# Patient Record
Sex: Female | Born: 1935 | Race: White | Hispanic: No | Marital: Married | Smoking: Never smoker
Health system: Southern US, Community
[De-identification: ages and names within clinical notes are randomized; demographics above are authoritative.]

## PROBLEM LIST (undated history)

## (undated) DIAGNOSIS — E079 Disorder of thyroid, unspecified: Secondary | ICD-10-CM

## (undated) DIAGNOSIS — M810 Age-related osteoporosis without current pathological fracture: Secondary | ICD-10-CM

## (undated) DIAGNOSIS — C4491 Basal cell carcinoma of skin, unspecified: Secondary | ICD-10-CM

## (undated) DIAGNOSIS — R001 Bradycardia, unspecified: Secondary | ICD-10-CM

## (undated) DIAGNOSIS — H409 Unspecified glaucoma: Secondary | ICD-10-CM

## (undated) DIAGNOSIS — I10 Essential (primary) hypertension: Secondary | ICD-10-CM

## (undated) HISTORY — PX: OSTEOCHONDROMA EXCISION: SHX2137

## (undated) HISTORY — PX: DILATION AND CURETTAGE OF UTERUS: SHX78

## (undated) HISTORY — PX: CERVICAL CONE BIOPSY: SUR198

## (undated) HISTORY — DX: Age-related osteoporosis without current pathological fracture: M81.0

## (undated) HISTORY — PX: ANKLE FRACTURE SURGERY: SHX122

## (undated) HISTORY — PX: CATARACT EXTRACTION, BILATERAL: SHX1313

## (undated) HISTORY — PX: TONSILLECTOMY: SUR1361

## (undated) HISTORY — DX: Unspecified glaucoma: H40.9

## (undated) HISTORY — PX: FRACTURE SURGERY: SHX138

## (undated) HISTORY — DX: Basal cell carcinoma of skin, unspecified: C44.91

## (undated) HISTORY — DX: Bradycardia, unspecified: R00.1

## (undated) HISTORY — PX: BLADDER SURGERY: SHX569

## (undated) HISTORY — PX: ABDOMINAL HYSTERECTOMY: SHX81

---

## 1998-12-28 ENCOUNTER — Ambulatory Visit (HOSPITAL_COMMUNITY): Admission: RE | Admit: 1998-12-28 | Discharge: 1998-12-28 | Payer: Self-pay | Admitting: Obstetrics & Gynecology

## 2003-08-01 ENCOUNTER — Encounter: Payer: Self-pay | Admitting: Emergency Medicine

## 2003-08-01 ENCOUNTER — Emergency Department (HOSPITAL_COMMUNITY): Admission: EM | Admit: 2003-08-01 | Discharge: 2003-08-01 | Payer: Self-pay | Admitting: Emergency Medicine

## 2003-09-01 ENCOUNTER — Ambulatory Visit (HOSPITAL_COMMUNITY): Admission: RE | Admit: 2003-09-01 | Discharge: 2003-09-01 | Payer: Self-pay | Admitting: Gastroenterology

## 2005-09-12 ENCOUNTER — Other Ambulatory Visit: Admission: RE | Admit: 2005-09-12 | Discharge: 2005-09-12 | Payer: Self-pay | Admitting: Family Medicine

## 2015-12-18 HISTORY — PX: BASAL CELL CARCINOMA EXCISION: SHX1214

## 2017-05-17 ENCOUNTER — Emergency Department (HOSPITAL_COMMUNITY): Payer: Medicare Other

## 2017-05-17 ENCOUNTER — Encounter (HOSPITAL_COMMUNITY): Payer: Self-pay | Admitting: Emergency Medicine

## 2017-05-17 ENCOUNTER — Emergency Department (HOSPITAL_COMMUNITY)
Admission: EM | Admit: 2017-05-17 | Discharge: 2017-05-17 | Disposition: A | Discharge status: Home / Self Care | Payer: Medicare Other | Attending: Emergency Medicine | Admitting: Emergency Medicine

## 2017-05-17 DIAGNOSIS — I10 Essential (primary) hypertension: Secondary | ICD-10-CM | POA: Insufficient documentation

## 2017-05-17 DIAGNOSIS — R531 Weakness: Secondary | ICD-10-CM | POA: Insufficient documentation

## 2017-05-17 HISTORY — DX: Essential (primary) hypertension: I10

## 2017-05-17 HISTORY — DX: Disorder of thyroid, unspecified: E07.9

## 2017-05-17 LAB — COMPREHENSIVE METABOLIC PANEL
ALK PHOS: 78 U/L (ref 38–126)
ALT: 13 U/L — AB (ref 14–54)
AST: 18 U/L (ref 15–41)
Albumin: 4.1 g/dL (ref 3.5–5.0)
Anion gap: 8 (ref 5–15)
BILIRUBIN TOTAL: 0.5 mg/dL (ref 0.3–1.2)
BUN: 27 mg/dL — AB (ref 6–20)
CALCIUM: 9.3 mg/dL (ref 8.9–10.3)
CO2: 23 mmol/L (ref 22–32)
CREATININE: 0.71 mg/dL (ref 0.44–1.00)
Chloride: 110 mmol/L (ref 101–111)
GFR calc non Af Amer: >60 mL/min (ref >60)
Glucose, Bld: 101 mg/dL — ABNORMAL HIGH (ref 65–99)
Potassium: 4.4 mmol/L (ref 3.5–5.1)
Sodium: 141 mmol/L (ref 135–145)
Total Protein: 6.9 g/dL (ref 6.5–8.1)

## 2017-05-17 LAB — CBC WITH DIFFERENTIAL/PLATELET
Basophils Absolute: 0 10*3/uL (ref 0.0–0.1)
Basophils Relative: 0 %
Eosinophils Absolute: 0.2 10*3/uL (ref 0.0–0.7)
Eosinophils Relative: 2 %
HEMATOCRIT: 38.3 % (ref 36.0–46.0)
Hemoglobin: 12.5 g/dL (ref 12.0–15.0)
Lymphocytes Relative: 29 %
Lymphs Abs: 2.4 10*3/uL (ref 0.7–4.0)
MCH: 29.4 pg (ref 26.0–34.0)
MCHC: 32.6 g/dL (ref 30.0–36.0)
MCV: 90.1 fL (ref 78.0–100.0)
MONO ABS: 0.8 10*3/uL (ref 0.1–1.0)
MONOS PCT: 9 %
NEUTROS ABS: 5.1 10*3/uL (ref 1.7–7.7)
Neutrophils Relative %: 60 %
PLATELETS: 341 10*3/uL (ref 150–400)
RBC: 4.25 MIL/uL (ref 3.87–5.11)
RDW: 13.3 % (ref 11.5–15.5)
WBC: 8.5 10*3/uL (ref 4.0–10.5)

## 2017-05-17 LAB — URINALYSIS, ROUTINE W REFLEX MICROSCOPIC
Bilirubin Urine: NEGATIVE
GLUCOSE, UA: NEGATIVE mg/dL
HGB URINE DIPSTICK: NEGATIVE
Ketones, ur: NEGATIVE mg/dL
LEUKOCYTES UA: NEGATIVE
Nitrite: NEGATIVE
PH: 6 (ref 5.0–8.0)
Protein, ur: NEGATIVE mg/dL
SPECIFIC GRAVITY, URINE: 1.006 (ref 1.005–1.030)

## 2017-05-17 LAB — LACTIC ACID, PLASMA: LACTIC ACID, VENOUS: 0.8 mmol/L (ref 0.5–1.9)

## 2017-05-17 LAB — TROPONIN I

## 2017-05-17 NOTE — ED Notes (Signed)
Pt verbalized understanding of discharge instructions and denies any further questions at this time.   

## 2017-05-17 NOTE — ED Notes (Signed)
Bed: TD42 Expected date:  Expected time:  Means of arrival:  Comments: EMS 77 F

## 2017-05-17 NOTE — Discharge Instructions (Signed)
Take your usual prescriptions as previously directed.  Take your blood pressure only once per day, a few times per week, either in the morning approximately 1 hour after you take your medicine(s) or in the evening before you go to bed.  Always sit quietly for at least 15 minutes before taking your blood pressure.  Keep a diary of your blood pressures to show your doctor at your follow up office visit, as well as your BP monitor to check it against your doctor's office BP reading.  Call your regular medical doctor on Monday morning to schedule a follow up appointment for Monday or Tuesday.  Return to the Emergency Department immediately sooner if worsening.

## 2017-05-17 NOTE — ED Notes (Signed)
ED Provider at bedside. 

## 2017-05-17 NOTE — ED Provider Notes (Signed)
[facility] DEPT Provider Note   CSN: [id number] Arrival date & time: 05/17/17  1515     History   Chief Complaint Chief Complaint  Patient presents with  . Weakness  . Abnormal BP Readings    HPI Kimberly Cline is a 83 y.o. female.  HPI  Pt was seen at 1540.  Per pt, c/o sudden onset and resolution of one episode of generalized weakness/fatigue that occurred today approximately 1130 PTA. Pt states she was standing inside her house and cleaning hummingbird feeders when her symptoms began. Pt states she then took her BP multiple times with SBP's ranging from 90's to 160's. Pt states her previous meals were approximately 8am and 1230pm. Pt states she "feels fine now."  Denies any other symptoms. Denies visual changes, no focal motor weakness, no tingling/numbness in extremities, no ataxia, no slurred speech, no facial droop, no near syncope/syncope, no CP/palpitations, no SOB/cough, no abd pain, no N/V/D.    Past Medical History:  Diagnosis Date  . Hypertension   . Thyroid disease     There are no active problems to display for this patient.   Past Surgical History:  Procedure Laterality Date  . ABDOMINAL HYSTERECTOMY    . ANKLE FRACTURE SURGERY    . BLADDER SURGERY    . DILATION AND CURETTAGE OF UTERUS    . EYE SURGERY    . FRACTURE SURGERY    . OSTEOCHONDROMA EXCISION      OB History    No data available       Home Medications    Prior to Admission medications   Not on File    Family History History reviewed. No pertinent family history.  Social History Social History  Substance Use Topics  . Smoking status: Never Smoker  . Smokeless tobacco: Never Used  . Alcohol use Yes     Allergies   Patient has no known allergies.   Review of Systems Review of Systems ROS: Statement: All systems negative except as marked or noted in the HPI; Constitutional: Negative for fever and chills. +generalized weakness/fatigue.; ; Eyes: Negative for eye pain,  redness and discharge. ; ; ENMT: Negative for ear pain, hoarseness, nasal congestion, sinus pressure and sore throat. ; ; Cardiovascular: Negative for chest pain, palpitations, diaphoresis, dyspnea and peripheral edema. ; ; Respiratory: Negative for cough, wheezing and stridor. ; ; Gastrointestinal: Negative for nausea, vomiting, diarrhea, abdominal pain, blood in stool, hematemesis, jaundice and rectal bleeding. . ; ; Genitourinary: Negative for dysuria, flank pain and hematuria. ; ; Musculoskeletal: Negative for back pain and neck pain. Negative for swelling and trauma.; ; Skin: Negative for pruritus, rash, abrasions, blisters, bruising and skin lesion.; ; Neuro: Negative for headache, lightheadedness and neck stiffness. Negative for altered level of consciousness, altered mental status, extremity weakness, paresthesias, involuntary movement, seizure and syncope.     Physical Exam Updated Vital Signs BP (!) 151/67 (BP Location: Left Arm)   Pulse 70   Temp 97.8 F (36.6 C) (Oral)   Resp (!) 21   Ht 5\' 4"  (1.626 m)   Wt 53.5 kg (118 lb)   SpO2 96%   BMI 20.25 kg/m   BP (!) 145/69   Pulse (!) 55   Temp 97.8 F (36.6 C) (Oral)   Resp (!) 23   Ht 5\' 4"  (1.626 m)   Wt 53.5 kg (118 lb)   SpO2 98%   BMI 20.25 kg/m    15:35:27 Orthostatic Vital Signs BL  Orthostatic Lying  BP- Lying: 136/63 (Pt lying flat for 5 mins prior to retrieval )  Pulse- Lying: 58      Orthostatic Sitting  BP- Sitting: 136/59  Pulse- Sitting: 57      Orthostatic Standing at 0 minutes  BP- Standing at 0 minutes: 146/63  Pulse- Standing at 0 minutes: 62    Physical Exam 1545: Physical examination:  Nursing notes reviewed; Vital signs and O2 SAT reviewed;  Constitutional: Well developed, Well nourished, Well hydrated, In no acute distress; Head:  Normocephalic, atraumatic; Eyes: EOMI, PERRL, No scleral icterus; ENMT: TM's clear bilat. Mouth and pharynx normal, Mucous membranes moist; Neck: Supple, Full  range of motion, No lymphadenopathy; Cardiovascular: Regular rate and rhythm, No gallop; Respiratory: Breath sounds clear & equal bilaterally, No wheezes.  Speaking full sentences with ease, Normal respiratory effort/excursion; Chest: Nontender, Movement normal; Abdomen: Soft, Nontender, Nondistended, Normal bowel sounds; Genitourinary: No CVA tenderness; Extremities: Pulses normal, No tenderness, No edema, No calf edema or asymmetry.; Neuro: AA&Ox3, Major CN grossly intact. Speech clear.  No facial droop.  No nystagmus. Grips equal. Strength 5/5 equal bilat UE's and LE's.  DTR 2/4 equal bilat UE's and LE's.  No gross sensory deficits.  Normal cerebellar testing bilat UE's (finger-nose) and LE's (heel-shin)..; Skin: Color normal, Warm, Dry.   ED Treatments / Results  Labs (all labs ordered are listed, but only abnormal results are displayed)   EKG  EKG Interpretation  Date/Time:  Friday May 17 2017 15:33:51 EDT Ventricular Rate:  57 PR Interval:    QRS Duration: 108 QT Interval:  419 QTC Calculation: 408 R Axis:   70 Text Interpretation:  Sinus rhythm RSR' in V1 or V2, probably normal variant Baseline wander No old tracing to compare Confirmed by [facility]  MD, Nunzio Cory [phone]) on 05/17/2017 3:51:33 PM       Radiology   Procedures Procedures (including critical care time)  Medications Ordered in ED Medications - No data to display   Initial Impression / Assessment and Plan / ED Course  I have reviewed the triage vital signs and the nursing notes.  Pertinent labs & imaging results that were available during my care of the patient were reviewed by me and considered in my medical decision making (see chart for details).  MDM Reviewed: previous chart, nursing note and vitals Reviewed previous: labs Interpretation: labs, ECG, x-ray and CT scan   Results for orders placed or performed during the hospital encounter of 05/17/17  Urinalysis, Routine w reflex microscopic  Result  Value Ref Range   Color, Urine STRAW (A) YELLOW   APPearance CLEAR CLEAR   Specific Gravity, Urine 1.006 1.005 - 1.030   pH 6.0 5.0 - 8.0   Glucose, UA NEGATIVE NEGATIVE mg/dL   Hgb urine dipstick NEGATIVE NEGATIVE   Bilirubin Urine NEGATIVE NEGATIVE   Ketones, ur NEGATIVE NEGATIVE mg/dL   Protein, ur NEGATIVE NEGATIVE mg/dL   Nitrite NEGATIVE NEGATIVE   Leukocytes, UA NEGATIVE NEGATIVE  Comprehensive metabolic panel  Result Value Ref Range   Sodium 141 135 - 145 mmol/L   Potassium 4.4 3.5 - 5.1 mmol/L   Chloride 110 101 - 111 mmol/L   CO2 23 22 - 32 mmol/L   Glucose, Bld 101 (H) 65 - 99 mg/dL   BUN 27 (H) 6 - 20 mg/dL   Creatinine, Ser 0.71 0.44 - 1.00 mg/dL   Calcium 9.3 8.9 - 10.3 mg/dL   Total Protein 6.9 6.5 - 8.1 g/dL   Albumin 4.1 3.5 - 5.0 g/dL  AST 18 15 - 41 U/L   ALT 13 (L) 14 - 54 U/L   Alkaline Phosphatase 78 38 - 126 U/L   Total Bilirubin 0.5 0.3 - 1.2 mg/dL   GFR calc non Af Amer >60 >60 mL/min   GFR calc Af Amer >60 >60 mL/min   Anion gap 8 5 - 15  Troponin I  Result Value Ref Range   Troponin I <0.03 <0.03 ng/mL  Lactic acid, plasma  Result Value Ref Range   Lactic Acid, Venous 0.8 0.5 - 1.9 mmol/L  CBC with Differential  Result Value Ref Range   WBC 8.5 4.0 - 10.5 K/uL   RBC 4.25 3.87 - 5.11 MIL/uL   Hemoglobin 12.5 12.0 - 15.0 g/dL   HCT 38.3 36.0 - 46.0 %   MCV 90.1 78.0 - 100.0 fL   MCH 29.4 26.0 - 34.0 pg   MCHC 32.6 30.0 - 36.0 g/dL   RDW 13.3 11.5 - 15.5 %   Platelets 341 150 - 400 K/uL   Neutrophils Relative % 60 %   Neutro Abs 5.1 1.7 - 7.7 K/uL   Lymphocytes Relative 29 %   Lymphs Abs 2.4 0.7 - 4.0 K/uL   Monocytes Relative 9 %   Monocytes Absolute 0.8 0.1 - 1.0 K/uL   Eosinophils Relative 2 %   Eosinophils Absolute 0.2 0.0 - 0.7 K/uL   Basophils Relative 0 %   Basophils Absolute 0.0 0.0 - 0.1 K/uL   Dg Chest 2 View Result Date: 05/17/2017 CLINICAL DATA:  83 year old female with weakness and labile blood pressure. EXAM: CHEST  2  VIEW COMPARISON:  None. FINDINGS: Cardiac size is at the upper limits of normal. Mild Calcified aortic atherosclerosis. Other mediastinal contours are within normal limits. Visualized tracheal air column is within normal limits. Large lung volumes but no pneumothorax, pulmonary edema, pleural effusion or confluent pulmonary opacity. Osteopenia. No acute osseous abnormality identified. Negative visible bowel gas pattern. IMPRESSION: No acute cardiopulmonary abnormality. Electronically Signed   By: Genevie Ann M.D.   On: 05/17/2017 16:20   Ct Head Wo Contrast Result Date: 05/17/2017 CLINICAL DATA:  Labile blood pressures.  Weakness. EXAM: CT HEAD WITHOUT CONTRAST TECHNIQUE: Contiguous axial images were obtained from the base of the skull through the vertex without intravenous contrast. COMPARISON:  None. FINDINGS: Brain: Normal appearance for age with mild volume loss but no evidence of old or acute small or large vessel infarction. No mass lesion, hemorrhage, hydrocephalus or extra-axial collection. Vascular: There is atherosclerotic calcification of the major vessels at the base of the brain. Skull: Normal Sinuses/Orbits: Clear/normal Other: None IMPRESSION: No acute or significant finding. Mild age related volume loss. Normal study for age. Electronically Signed   By: Nelson Chimes M.D.   On: 05/17/2017 16:01    1830:  Pt is not orthostatic on VS. Pt has tol PO well without N/V. Pt has ambulated with steady gait, easy resps, NAD. VS remain stable. Workup is reassuring. No clear indication for admission at this time. Pt wants to go home. Dx and testing d/w pt.  Questions answered.  Verb understanding, agreeable to d/c home with outpt f/u.   Final Clinical Impressions(s) / ED Diagnoses   Final diagnoses:  None    New Prescriptions New Prescriptions   No medications on file     Francine Graven, DO 05/20/17 2106

## 2017-05-17 NOTE — ED Notes (Signed)
Patient transported to X-ray 

## 2017-05-17 NOTE — ED Triage Notes (Signed)
EMS with ambulatory patient who c/o fluctuating blood pressures. Readings have been any where from systolic of 75'Q to 492'E. PCP advised pt to come via EMS due to accompanied weakness. Pt alert and ambulatory denies pain. NAD at triage.  Vitals stable. EKG in process.

## 2017-05-19 LAB — URINE CULTURE: Culture: 20000 — AB

## 2017-05-23 ENCOUNTER — Encounter (INDEPENDENT_AMBULATORY_CARE_PROVIDER_SITE_OTHER): Payer: Self-pay

## 2017-05-23 ENCOUNTER — Encounter: Payer: Self-pay | Admitting: Internal Medicine

## 2017-05-23 ENCOUNTER — Ambulatory Visit (INDEPENDENT_AMBULATORY_CARE_PROVIDER_SITE_OTHER): Payer: Medicare Other | Admitting: Internal Medicine

## 2017-05-23 VITALS — BP 162/64 | HR 53 | Ht 63.0 in | Wt 120.0 lb

## 2017-05-23 DIAGNOSIS — I10 Essential (primary) hypertension: Secondary | ICD-10-CM | POA: Diagnosis not present

## 2017-05-23 DIAGNOSIS — R001 Bradycardia, unspecified: Secondary | ICD-10-CM

## 2017-05-23 DIAGNOSIS — R55 Syncope and collapse: Secondary | ICD-10-CM

## 2017-05-23 NOTE — Progress Notes (Signed)
ELECTROPHYSIOLOGY CONSULT NOTE  Patient ID: Kimberly Cline, MRN: [id number], DOB/AGE: 02/01/1932 83 y.o. Admit date: (Not on file) Date of Consult: 05/23/2017  Primary Physician: Carol Ada, MD Primary Cardiologist: new   Kimberly Cline is being seen today for the evaluation of hypotension at the request of  Carol Ada, MD.   HPI Kimberly Cline is a 83 y.o. female  Referred because of an unusual spell. Last week she felt weak. Her husband took her blood pressure home and was noted to be 66/40 or so with a pulse of 48. Over the ensuing 15 minutes on repeated measurements it gradually came up to the mid 90s. Finally got in touch with the doctor's office who recommended they call EMS. By then blood pressure is 120. On arrival of EMS for vital signs demonstrated a blood pressure 160. Heart rates were in the 50s.  Her husband noted that she had been pale. This event occurred while she was standing. She had a previous episode a couple weeks before; however, she recalls none of the events surrounding this occurring.  She does not have orthostatic lightheadedness. She does not have shower intolerance. She's had no problems with constipation and difficulty with her bladder or dry mouth or dry eyes.  Orthostatics  were normal in emergency room.  Other laboratories were unremarkable 05/17/17 from the emergency room    Past Medical History:  Diagnosis Date  . Glaucoma   . Hypertension   . Nodular basal cell carcinoma   . Osteoporosis   . Thyroid disease       Surgical History:  Past Surgical History:  Procedure Laterality Date  . ABDOMINAL HYSTERECTOMY    . ANKLE FRACTURE SURGERY    . BASAL CELL CARCINOMA EXCISION  2017  . BLADDER SURGERY    . CATARACT EXTRACTION, BILATERAL Bilateral 12 & 11 of 2016  . CERVICAL CONE BIOPSY    . DILATION AND CURETTAGE OF UTERUS    . EYE SURGERY    . FRACTURE SURGERY    . OSTEOCHONDROMA EXCISION    . TONSILLECTOMY        Home Meds: Prior to Admission medications   Medication Sig Start Date End Date Taking? Authorizing Provider  amLODipine (NORVASC) 5 MG tablet Take 5 mg by mouth daily.   Yes [provider]  levothyroxine (SYNTHROID, LEVOTHROID) 88 MCG tablet Take 88 mcg by mouth daily before breakfast.    Yes [provider]  lisinopril (PRINIVIL,ZESTRIL) 20 MG tablet Take 20 mg by mouth daily.   Yes [provider]  timolol (BETIMOL) 0.5 % ophthalmic solution Place 1 drop into both eyes daily.   Yes [provider]    Allergies:  Allergies  Allergen Reactions  . Actonel [Risedronate Sodium]     Serve muscle pain  . Evista [Raloxifene Hcl]     Leg cramps    Social History   Social History  . Marital status: Single    Spouse name: N/A  . Number of children: N/A  . Years of education: N/A   Occupational History  . Not on file.   Social History Main Topics  . Smoking status: Never Smoker  . Smokeless tobacco: Never Used  . Alcohol use Yes  . Drug use: No  . Sexual activity: Not on file   Other Topics Concern  . Not on file   Social History Narrative  . No narrative on file     Family History  Problem  Relation Age of Onset  . Other Mother        gun shot wound  . Hypertension Father   . Cancer Sister        breast  . Other Sister        mets  . CVA Brother   . Diabetes Brother   . Hypertension Brother   . Rheum arthritis Daughter   . Diabetes Son        on dialysis     ROS:  Please see the history of present illness.     All other systems reviewed and negative.    Physical Exam: Blood pressure (!) 162/64, pulse (!) 53, height 5\' 3"  (1.6 m), weight 120 lb (54.4 kg), SpO2 98 %. General: Well developed, well nourished female in no acute distress. Head: Normocephalic, atraumatic, sclera non-icteric, no xanthomas, nares are without discharge. EENT: normal  Lymph Nodes:  none Neck: Negative for carotid bruits. JVD not  elevated. Back:without scoliosis kyphosis Lungs: Clear bilaterally to auscultation without wheezes, rales, or rhonchi. Breathing is unlabored. Heart: RRR with S1 S2.  2/6 systolic murmur . No rubs, or gallops appreciated. Abdomen: Soft, non-tender, non-distended with normoactive bowel sounds. No hepatomegaly. No rebound/guarding. No obvious abdominal masses. Msk:  Strength and tone appear normal for age. Extremities: No clubbing or cyanosis. No edema.  Distal pedal pulses are 2+ and equal bilaterally. Skin: Warm and Dry Neuro: Alert and oriented X 3. CN III-XII intact Grossly normal sensory and motor function . Psych:  Responds to questions appropriately with a normal affect.      Labs: Cardiac Enzymes No results for input(s): CKTOTAL, CKMB, TROPONINI in the last 72 hours. CBC Lab Results  Component Value Date   WBC 8.5 05/17/2017   HGB 12.5 05/17/2017   HCT 38.3 05/17/2017   MCV 90.1 05/17/2017   PLT 341 05/17/2017   PROTIME: No results for input(s): LABPROT, INR in the last 72 hours. Chemistry  Recent Labs Lab 05/17/17 1625  NA 141  K 4.4  CL 110  CO2 23  BUN 27*  CREATININE 0.71  CALCIUM 9.3  PROT 6.9  BILITOT 0.5  ALKPHOS 78  ALT 13*  AST 18  GLUCOSE 101*   Lipids No results found for: CHOL, HDL, LDLCALC, TRIG BNP No results found for: PROBNP Thyroid Function Tests: No results for input(s): TSH, T4TOTAL, T3FREE, THYROIDAB in the last 72 hours.  Invalid input(s): FREET3 Miscellaneous No results found for: DDIMER  Radiology/Studies:  Dg Chest 2 View  Result Date: 05/17/2017 CLINICAL DATA:  83 year old female with weakness and labile blood pressure. EXAM: CHEST  2 VIEW COMPARISON:  None. FINDINGS: Cardiac size is at the upper limits of normal. Mild Calcified aortic atherosclerosis. Other mediastinal contours are within normal limits. Visualized tracheal air column is within normal limits. Large lung volumes but no pneumothorax, pulmonary edema, pleural  effusion or confluent pulmonary opacity. Osteopenia. No acute osseous abnormality identified. Negative visible bowel gas pattern. IMPRESSION: No acute cardiopulmonary abnormality. Electronically Signed   By: Genevie Ann M.D.   On: 05/17/2017 16:20   Ct Head Wo Contrast  Result Date: 05/17/2017 CLINICAL DATA:  Labile blood pressures.  Weakness. EXAM: CT HEAD WITHOUT CONTRAST TECHNIQUE: Contiguous axial images were obtained from the base of the skull through the vertex without intravenous contrast. COMPARISON:  None. FINDINGS: Brain: Normal appearance for age with mild volume loss but no evidence of old or acute small or large vessel infarction. No mass lesion, hemorrhage, hydrocephalus or extra-axial  collection. Vascular: There is atherosclerotic calcification of the major vessels at the base of the brain. Skull: Normal Sinuses/Orbits: Clear/normal Other: None IMPRESSION: No acute or significant finding. Mild age related volume loss. Normal study for age. Electronically Signed   By: Nelson Chimes M.D.   On: 05/17/2017 16:01    EKG: sinus @ 51 13/09/44  Assessment and Plan:  Hypotension   The patient had an episode of hypotension documented at home. The patient was pale suggesting that the data was correct. The symptoms also support. At the data was correct. Serial blood pressure monitoring over the ensuing hour demonstrated relatively rapid normalization. On arrival of EMS the data were normal. Orthostatic vital signs are unrevealing. In the emergency room in today. She does not have other symptoms to suggest systemic autonomic dysfunction. In the absence of objective evidence of orthostasis, or systemic diseases like amyloid, Shy-Drager are hard to invoke. The only trigger that I see is prolonged standing; unfortunately her first episode she doesn't recall.  Hence, I have advised her to be cognizant of the prodrome. She should be sitting or recombinant with his recurring. I suggested to her husband that  they get an [employer] monitor.  Her heart rate of 48 is unlikely responsible as today with a heart rate of 51 her blood pressure is 160. Her heart rhythm at 48 might have been quite different, i.e. subcutaneous like AIVR or junctional rhythm that could have been associated with a pacemaker like syndrome that derives from simultaneous AV  contraction. Hence,  elucidation of the heart rhythm might be informative.         Virl Axe

## 2017-05-23 NOTE — Patient Instructions (Addendum)
Medication Instructions:  Your physician recommends that you continue on your current medications as directed. Please refer to the Current Medication list given to you today.   Labwork: None Ordered   Testing/Procedures: None Ordered    Follow-Up: Your physician recommends that you schedule a follow-up appointment in: 3-4 months with Dr. Caryl Comes     Any Other Special Instructions Will Be Listed Below (If Applicable).     If you need a refill on your cardiac medications before your next appointment, please call your pharmacy.

## 2017-08-30 ENCOUNTER — Encounter: Payer: Self-pay | Admitting: Internal Medicine

## 2017-09-13 ENCOUNTER — Encounter: Payer: Self-pay | Admitting: Internal Medicine

## 2017-09-13 ENCOUNTER — Ambulatory Visit (INDEPENDENT_AMBULATORY_CARE_PROVIDER_SITE_OTHER): Payer: Medicare Other | Admitting: Internal Medicine

## 2017-09-13 VITALS — BP 140/70 | HR 64 | Ht 63.0 in | Wt 122.0 lb

## 2017-09-13 DIAGNOSIS — R001 Bradycardia, unspecified: Secondary | ICD-10-CM | POA: Diagnosis not present

## 2017-09-13 DIAGNOSIS — I959 Hypotension, unspecified: Secondary | ICD-10-CM | POA: Diagnosis not present

## 2017-09-13 NOTE — Progress Notes (Signed)
      Patient Care Team: Carol Ada, MD as PCP - General (Family Medicine)   HPI  Kimberly Cline is a 83 y.o. female Seen in followup for a weak spell associated with hypotension in the context of chronic bradycardia   She feels good.  The patient denies chest pain, shortness of breath, nocturnal dyspnea, orthopnea or peripheral edema.  There have been no palpitations, lightheadedness or syncope.    She was advised to get an [employer] Monitor to try and elucidate the bradycardia that was noted-- but no interval events      Past Medical History:  Diagnosis Date  . Glaucoma   . Hypertension   . Nodular basal cell carcinoma   . Osteoporosis   . Thyroid disease     Past Surgical History:  Procedure Laterality Date  . ABDOMINAL HYSTERECTOMY    . ANKLE FRACTURE SURGERY    . BASAL CELL CARCINOMA EXCISION  2017  . BLADDER SURGERY    . CATARACT EXTRACTION, BILATERAL Bilateral 12 & 11 of 2016  . CERVICAL CONE BIOPSY    . DILATION AND CURETTAGE OF UTERUS    . EYE SURGERY    . FRACTURE SURGERY    . OSTEOCHONDROMA EXCISION    . TONSILLECTOMY      Current Outpatient Prescriptions  Medication Sig Dispense Refill  . amLODipine (NORVASC) 5 MG tablet Take 5 mg by mouth daily.    Marland Kitchen levothyroxine (SYNTHROID, LEVOTHROID) 88 MCG tablet Take 88 mcg by mouth daily before breakfast.     . lisinopril (PRINIVIL,ZESTRIL) 20 MG tablet Take 20 mg by mouth daily.    . timolol (BETIMOL) 0.5 % ophthalmic solution Place 1 drop into both eyes daily.     No current facility-administered medications for this visit.     Allergies  Allergen Reactions  . Actonel [Risedronate Sodium]     Serve muscle pain  . Evista [Raloxifene Hcl]     Leg cramps      Review of Systems negative except from HPI and PMH  Physical Exam BP 140/70   Pulse 64   Ht 5\' 3"  (1.6 m)   Wt 122 lb (55.3 kg)   SpO2 97%   BMI 21.61 kg/m  Well developed and nourished in no acute distress HENT normal Neck  supple with JVP-flat Carotids brisk and full without bruits Clear Regular rate and rhythm, 2/6 sys +S4  Abd-soft with active BS without hepatomegaly No Clubbing cyanosis edema Skin-warm and dry A & Oriented  Grossly normal sensory and motor function   ECG sinus 52 15/08/42  Assessment and  Plan  Hypertension  Spell   She is stable without recurrent symptoms and BP is reasonably controlled  Will see again prn   She is to see her PCP next months     Current medicines are reviewed at length with the patient today .  The patient does not  have concerns regarding medicines.

## 2017-09-13 NOTE — Patient Instructions (Signed)
Medication Instructions: - Your physician recommends that you continue on your current medications as directed. Please refer to the Current Medication list given to you today.  Labwork: - none ordered  Procedures/Testing: - none ordered  Follow-Up: - Dr. Klein will see you back on an as needed basis.  Any Additional Special Instructions Will Be Listed Below (If Applicable).     If you need a refill on your cardiac medications before your next appointment, please call your pharmacy.   

## 2018-07-17 NOTE — Progress Notes (Signed)
Chief Complaint  Patient presents with  . Follow-up    bradycardia   History of Present Illness: 84 yo female with history of HTN and bradycardia who is here today for cardiac follow up. I take care of her husband but have not seen her as a patient in my office. She has been followed by Dr. Caryl Comes for bradycardia. She is here today to establish care with me. She has no prior cardiac disease. She denies chest pain or dyspnea. No LE edema. She had a near syncopal event in June 2018 at home with low heart and low BP. Dr. Caryl Comes evaluated her and did not suggest further evaluation.   Primary Care Physician: Carol Ada, MD  Past Medical History:  Diagnosis Date  . Glaucoma   . Hypertension   . Nodular basal cell carcinoma   . Osteoporosis   . Thyroid disease     Past Surgical History:  Procedure Laterality Date  . ABDOMINAL HYSTERECTOMY    . ANKLE FRACTURE SURGERY    . BASAL CELL CARCINOMA EXCISION  2017  . BLADDER SURGERY    . CATARACT EXTRACTION, BILATERAL Bilateral 12 & 11 of 2016  . CERVICAL CONE BIOPSY    . DILATION AND CURETTAGE OF UTERUS    . FRACTURE SURGERY    . OSTEOCHONDROMA EXCISION    . TONSILLECTOMY      Current Outpatient Medications  Medication Sig Dispense Refill  . amLODipine (NORVASC) 5 MG tablet Take 5 mg by mouth daily.    Marland Kitchen levothyroxine (SYNTHROID, LEVOTHROID) 88 MCG tablet Take 88 mcg by mouth daily before breakfast.     . lisinopril (PRINIVIL,ZESTRIL) 20 MG tablet Take 20 mg by mouth daily.    . timolol (BETIMOL) 0.5 % ophthalmic solution Place 1 drop into both eyes daily.     No current facility-administered medications for this visit.     Allergies  Allergen Reactions  . Actonel [Risedronate Sodium]     Serve muscle pain  . Evista [Raloxifene Hcl]     Leg cramps    Social History   Socioeconomic History  . Marital status: Married    Spouse name: Not on file  . Number of children: 4  . Years of education: Not on file  . Highest  education level: Not on file  Occupational History  . Occupation: Retired-works at [employer]scape [employer]  Social Needs  . Financial resource strain: Not on file  . Food insecurity:    Worry: Not on file    Inability: Not on file  . Transportation needs:    Medical: Not on file    Non-medical: Not on file  Tobacco Use  . Smoking status: Never Smoker  . Smokeless tobacco: Never Used  Substance and Sexual Activity  . Alcohol use: Yes    Comment: glass of wine per day  . Drug use: No  . Sexual activity: Not Currently  Lifestyle  . Physical activity:    Days per week: Not on file    Minutes per session: Not on file  . Stress: Not on file  Relationships  . Social connections:    Talks on phone: Not on file    Gets together: Not on file    Attends religious service: Not on file    Active member of club or organization: Not on file    Attends meetings of clubs or organizations: Not on file    Relationship status: Not on file  . Intimate partner violence:  Fear of current or ex partner: Not on file    Emotionally abused: Not on file    Physically abused: Not on file    Forced sexual activity: Not on file  Other Topics Concern  . Not on file  Social History Narrative  . Not on file    Family History  Problem Relation Age of Onset  . Other Mother        gun shot wound  . Hypertension Father   . Cancer Sister        breast  . Other Sister        mets  . CVA Brother   . Diabetes Brother   . Hypertension Brother   . Rheum arthritis Daughter   . Diabetes Son        on dialysis    Review of Systems:  As stated in the HPI and otherwise negative.   BP (!) 150/70   Pulse 67   Ht 5\' 3"  (1.6 m)   Wt 124 lb 12.8 oz (56.6 kg)   SpO2 97%   BMI 22.11 kg/m   Physical Examination: General: Well developed, well nourished, NAD  HEENT: OP clear, mucus membranes moist  SKIN: warm, dry. No rashes. Neuro: No focal deficits  Musculoskeletal: Muscle strength 5/5 all ext    Psychiatric: Mood and affect normal  Neck: No JVD, no carotid bruits, no thyromegaly, no lymphadenopathy.  Lungs:Clear bilaterally, no wheezes, rhonci, crackles Cardiovascular: Regular rate and rhythm. No murmurs, gallops or rubs. Abdomen:Soft. Bowel sounds present. Non-tender.  Extremities: No lower extremity edema. Pulses are 2 + in the bilateral DP/PT.  EKG:  EKG is not ordered today. The ekg ordered today demonstrates   Recent Labs: No results found for requested labs within last 8760 hours.   Lipid Panel No results found for: CHOL, TRIG, HDL, CHOLHDL, VLDL, LDLCALC, LDLDIRECT   Wt Readings from Last 3 Encounters:  07/18/18 124 lb 12.8 oz (56.6 kg)  09/13/17 122 lb (55.3 kg)  05/23/17 120 lb (54.4 kg)     Other studies Reviewed: Additional studies/ records that were reviewed today include:  Review of the above records demonstrates:    Assessment and Plan:   1. Bradycardia: HR is in a normal range today. No dizziness. Will arrange echo to assess LV function.   2. HTN: BP is well controlled at home.   Current medicines are reviewed at length with the patient today.  The patient does not have concerns regarding medicines.  The following changes have been made:  no change  Labs/ tests ordered today include:   Orders Placed This Encounter  Procedures  . ECHOCARDIOGRAM COMPLETE     Disposition:   FU with me in 12 months   Signed, Lauree Chandler, MD 07/18/2018 10:36 AM    [facility] [place], [place], [place]  [address] Phone: [phone]; Fax: [phone]

## 2018-07-18 ENCOUNTER — Ambulatory Visit (HOSPITAL_COMMUNITY): Payer: Medicare Other | Attending: Cardiovascular Disease

## 2018-07-18 ENCOUNTER — Encounter: Payer: Self-pay | Admitting: Cardiovascular Disease

## 2018-07-18 ENCOUNTER — Other Ambulatory Visit: Payer: Self-pay

## 2018-07-18 ENCOUNTER — Ambulatory Visit: Payer: Medicare Other | Admitting: Cardiovascular Disease

## 2018-07-18 VITALS — BP 150/70 | HR 67 | Ht 63.0 in | Wt 124.8 lb

## 2018-07-18 DIAGNOSIS — I10 Essential (primary) hypertension: Secondary | ICD-10-CM

## 2018-07-18 DIAGNOSIS — R001 Bradycardia, unspecified: Secondary | ICD-10-CM

## 2018-07-18 LAB — ECHOCARDIOGRAM COMPLETE
HEIGHTINCHES: 63 in
Weight: 1996.8 oz

## 2018-07-18 NOTE — Patient Instructions (Signed)
Medication Instructions:  Your physician recommends that you continue on your current medications as directed. Please refer to the Current Medication list given to you today.   Labwork: none  Testing/Procedures: Your physician has requested that you have an echocardiogram. Echocardiography is a painless test that uses sound waves to create images of your heart. It provides your doctor with information about the size and shape of your heart and how well your heart's chambers and valves are working. This procedure takes approximately one hour. There are no restrictions for this procedure.    Follow-Up: Your physician recommends that you schedule a follow-up appointment in: 12 months. Please call our office in about 8 months to schedule this appointment    Any Other Special Instructions Will Be Listed Below (If Applicable).     If you need a refill on your cardiac medications before your next appointment, please call your pharmacy.   

## 2019-10-25 NOTE — Progress Notes (Signed)
Chief Complaint  Patient presents with  . Follow-up    BradyCardia   History of Present Illness: 85 yo female with history of HTN and bradycardia who is here today for cardiac follow up. I saw her for the first time in 2019.  She has been followed by Dr. Caryl Comes for bradycardia. She had a near syncopal event in June 2018 at home with low heart and low BP. Dr. Caryl Comes evaluated her and did not suggest further evaluation. Echo August 2019 with LVEF=60-65%. No valve disease. Her heart rate was in a normal range when she was seen in our office in August 2019.   She is here today for follow up. The patient denies any chest pain, dyspnea, palpitations, lower extremity edema, orthopnea, PND, dizziness, near syncope or syncope.   Primary Care Physician: Carol Ada, MD  Past Medical History:  Diagnosis Date  . Glaucoma   . Hypertension   . Nodular basal cell carcinoma   . Osteoporosis   . Thyroid disease     Past Surgical History:  Procedure Laterality Date  . ABDOMINAL HYSTERECTOMY    . ANKLE FRACTURE SURGERY    . BASAL CELL CARCINOMA EXCISION  2017  . BLADDER SURGERY    . CATARACT EXTRACTION, BILATERAL Bilateral 12 & 11 of 2016  . CERVICAL CONE BIOPSY    . DILATION AND CURETTAGE OF UTERUS    . FRACTURE SURGERY    . OSTEOCHONDROMA EXCISION    . TONSILLECTOMY      Current Outpatient Medications  Medication Sig Dispense Refill  . amLODipine (NORVASC) 10 MG tablet Take 5 mg by mouth daily.     Marland Kitchen latanoprost (XALATAN) 0.005 % ophthalmic solution 1 drop at bedtime.    Marland Kitchen levothyroxine (SYNTHROID) 112 MCG tablet Take 112 mcg by mouth daily before breakfast.     . lisinopril (PRINIVIL,ZESTRIL) 20 MG tablet Take 20 mg by mouth daily.    . timolol (BETIMOL) 0.5 % ophthalmic solution Place 1 drop into both eyes daily.     No current facility-administered medications for this visit.     Allergies  Allergen Reactions  . Actonel [Risedronate Sodium]     Serve muscle pain  . Evista  [Raloxifene Hcl]     Leg cramps    Social History   Socioeconomic History  . Marital status: Married    Spouse name: Not on file  . Number of children: 4  . Years of education: Not on file  . Highest education level: Not on file  Occupational History  . Occupation: Retired-works at Landscape [employer]  Social Needs  . Financial resource strain: Not on file  . Food insecurity    Worry: Not on file    Inability: Not on file  . Transportation needs    Medical: Not on file    Non-medical: Not on file  Tobacco Use  . Smoking status: Never Smoker  . Smokeless tobacco: Never Used  Substance and Sexual Activity  . Alcohol use: Yes    Comment: glass of wine per day  . Drug use: No  . Sexual activity: Not Currently  Lifestyle  . Physical activity    Days per week: Not on file    Minutes per session: Not on file  . Stress: Not on file  Relationships  . Social Herbalist on phone: Not on file    Gets together: Not on file    Attends religious service: Not on file    Active  member of club or organization: Not on file    Attends meetings of clubs or organizations: Not on file    Relationship status: Not on file  . Intimate partner violence    Fear of current or ex partner: Not on file    Emotionally abused: Not on file    Physically abused: Not on file    Forced sexual activity: Not on file  Other Topics Concern  . Not on file  Social History Narrative  . Not on file    Family History  Problem Relation Age of Onset  . Other Mother        gun shot wound  . Hypertension Father   . Cancer Sister        breast  . Other Sister        mets  . CVA Brother   . Diabetes Brother   . Hypertension Brother   . Rheum arthritis Daughter   . Diabetes Son        on dialysis    Review of Systems:  As stated in the HPI and otherwise negative.   BP (!) 172/80   Pulse (!) 57   Ht 5\' 3"  (1.6 m)   Wt 120 lb 12.8 oz (54.8 kg)   SpO2 98%   BMI 21.40 kg/m   Physical  Examination:  General: Well developed, well nourished, NAD  HEENT: OP clear, mucus membranes moist  SKIN: warm, dry. No rashes. Neuro: No focal deficits  Musculoskeletal: Muscle strength 5/5 all ext  Psychiatric: Mood and affect normal  Neck: No JVD, no carotid bruits, no thyromegaly, no lymphadenopathy.  Lungs:Clear bilaterally, no wheezes, rhonci, crackles Cardiovascular: Regular rate and rhythm. No murmurs, gallops or rubs. Abdomen:Soft. Bowel sounds present. Non-tender.  Extremities: No lower extremity edema. Pulses are 2 + in the bilateral DP/PT.  EKG:  EKG is ordered today. The ekg ordered today demonstrates Sinus bradycardia, rate 57 bpm.   Echo August 2019: Left ventricle: The cavity size was normal. Wall thickness was   normal. Systolic function was normal. The estimated ejection   fraction was in the range of 60% to 65%. The study is not   technically sufficient to allow evaluation of LV diastolic   function.  Recent Labs: No results found for requested labs within last 8760 hours.   Lipid Panel No results found for: CHOL, TRIG, HDL, CHOLHDL, VLDL, LDLCALC, LDLDIRECT   Wt Readings from Last 3 Encounters:  10/26/19 120 lb 12.8 oz (54.8 kg)  07/18/18 124 lb 12.8 oz (56.6 kg)  09/13/17 122 lb (55.3 kg)     Other studies Reviewed: Additional studies/ records that were reviewed today include:  Review of the above records demonstrates:    Assessment and Plan:   1. Bradycardia: Heart rate is 57 today. No dizziness or near syncope over last year. Echo August 2019 with normal LV systolic function.   2. HTN: BP is elevated today. Has been 0000000 systolic at home. She will follow closely over next few weeks and let Dr. Tamala Julian know the readings.   Current medicines are reviewed at length with the patient today.  The patient does not have concerns regarding medicines.  The following changes have been made:  no change  Labs/ tests ordered today include:   Orders  Placed This Encounter  Procedures  . EKG 12-Lead     Disposition:   FU with me in 12 months   Signed, Lauree Chandler, MD 10/26/2019 9:07 AM  [facility] [place], [place], [place]  [address] Phone: [phone]; Fax: [phone]

## 2019-10-26 ENCOUNTER — Encounter (INDEPENDENT_AMBULATORY_CARE_PROVIDER_SITE_OTHER): Payer: Self-pay

## 2019-10-26 ENCOUNTER — Encounter: Payer: Self-pay | Admitting: Cardiovascular Disease

## 2019-10-26 ENCOUNTER — Ambulatory Visit: Payer: Medicare Other | Admitting: Cardiovascular Disease

## 2019-10-26 ENCOUNTER — Encounter

## 2019-10-26 ENCOUNTER — Other Ambulatory Visit: Payer: Self-pay

## 2019-10-26 VITALS — BP 172/80 | HR 57 | Ht 63.0 in | Wt 120.8 lb

## 2019-10-26 DIAGNOSIS — I10 Essential (primary) hypertension: Secondary | ICD-10-CM | POA: Diagnosis not present

## 2019-10-26 DIAGNOSIS — R001 Bradycardia, unspecified: Secondary | ICD-10-CM | POA: Diagnosis not present

## 2019-10-26 NOTE — Patient Instructions (Signed)
Medication Instructions:  none *If you need a refill on your cardiac medications before your next appointment, please call your pharmacy*  Lab Work: none If you have labs (blood work) drawn today and your tests are completely normal, you will receive your results only by: Marland Kitchen MyChart Message (if you have MyChart) OR . A paper copy in the mail If you have any lab test that is abnormal or we need to change your treatment, we will call you to review the results.  Testing/Procedures: None  Follow-Up: At [facility], you and your health needs are our priority.  As part of our continuing mission to provide you with exceptional heart care, we have created designated Provider Care Teams.  These Care Teams include your primary Cardiologist (physician) and Advanced Practice Providers (APPs -  Physician Assistants and Nurse Practitioners) who all work together to provide you with the care you need, when you need it.  Your next appointment:   12 months  The format for your next appointment:   In Person  Provider:   Lauree Chandler, MD  Other Instructions

## 2019-12-03 ENCOUNTER — Telehealth: Payer: Self-pay | Admitting: Cardiovascular Disease

## 2019-12-03 MED ORDER — AMLODIPINE BESYLATE 10 MG PO TABS: 10.0000 mg | ORAL_TABLET | Freq: Every day | ORAL | 3 refills | Status: DC

## 2019-12-03 NOTE — Telephone Encounter (Signed)
Mail correspondence from Ms. Linan with blood pressure readings mostly over [id number] systolic. Can we confirm with her that she is taking Norvasc 5 mg daily? If so, I would like to increase the Norvasc to 10 mg daily. If she is already on 10 mg of Norvasc will need to increase her Lisinopril to 40 mg daily. Thanks, chris

## 2019-12-03 NOTE — Addendum Note (Signed)
Addended by: Rodman Key on: 12/03/2019 05:22 PM   Modules accepted: Orders

## 2019-12-03 NOTE — Telephone Encounter (Signed)
Called patient and confirmed medications.  She is taking 5 mg amlodipine and 20 mg lisinopril.  She will increase amlodipine to 10 mg daily and continue to monitor blood pressures.  She will call or send in the readings in about 2-3 weeks.

## 2019-12-25 ENCOUNTER — Telehealth: Payer: Self-pay | Admitting: Cardiovascular Disease

## 2019-12-25 NOTE — Telephone Encounter (Signed)
Patient was instructed to increase amlodipine to 10 mg daily on 12/03/19.  Seem to be improving.  Last correspondence had diastolic readings mostly over 150.  Will route to Dr. Angelena Form for review.

## 2019-12-25 NOTE — Telephone Encounter (Signed)
Patient called to give BP reading:  132/70 HR 52 137/59 HR 52 139/55 HR 50 135/56 HR 53 136/56 HR 48 137/55 HR 55 145/62 HR 54 138/57 HR 51 130/56 HR 52

## 2019-12-25 NOTE — Telephone Encounter (Signed)
*  STAT* If patient is at the pharmacy, call can be transferred to refill team.   1. Which medications need to be refilled? (please list name of each medication and dose if known)  amLODipine (NORVASC) 10 MG tablet lisinopril (PRINIVIL,ZESTRIL) 20 MG tablet  2. Which pharmacy/location (including street and city if local pharmacy) is medication to be sent to? [place], [place] - [id number]  3. Do they need a 30 day or 90 day supply?  30 day  Patient is out of meds.

## 2019-12-28 MED ORDER — AMLODIPINE BESYLATE 10 MG PO TABS: 10.0000 mg | ORAL_TABLET | Freq: Every day | ORAL | 3 refills | Status: DC

## 2019-12-28 MED ORDER — LISINOPRIL 20 MG PO TABS: 20.0000 mg | ORAL_TABLET | Freq: Every day | ORAL | 3 refills | Status: DC

## 2019-12-28 NOTE — Telephone Encounter (Signed)
Pt's medications were sent to pt's pharmacy as requested. Confirmation received.  

## 2019-12-28 NOTE — Telephone Encounter (Signed)
Called patient back and informed of recommendation to continue amlodipine at current dose.

## 2019-12-28 NOTE — Telephone Encounter (Signed)
BP much better on Norvasc 10 mg daily. Continue current dose. Gerald Stabs

## 2020-01-08 ENCOUNTER — Ambulatory Visit: Payer: Medicare Other | Attending: Internal Medicine

## 2020-01-08 DIAGNOSIS — Z23 Encounter for immunization: Secondary | ICD-10-CM

## 2020-01-08 NOTE — Progress Notes (Signed)
   Covid-19 Vaccination Clinic  Name:  Florena Gurkin    MRN: [id number] DOB: 03-15-1932  01/08/2020  Ms. Barbeau was observed post Covid-19 immunization for 15 minutes without incidence. She was provided with Vaccine Information Sheet and instruction to access the V-Safe system.   Ms. Oliveros was instructed to call 911 with any severe reactions post vaccine: Marland Kitchen Difficulty breathing  . Swelling of your face and throat  . A fast heartbeat  . A bad rash all over your body  . Dizziness and weakness    Immunizations Administered    Name Date Dose VIS Date Route   Pfizer COVID-19 Vaccine 01/08/2020 11:18 AM 0.3 mL 11/27/2019 Intramuscular   Manufacturer: [place]   Lot: [id number]   [place]: [id number]

## 2020-01-14 ENCOUNTER — Ambulatory Visit: Payer: Medicare Other

## 2020-01-29 ENCOUNTER — Ambulatory Visit: Payer: Medicare Other | Attending: Internal Medicine

## 2020-01-29 DIAGNOSIS — Z23 Encounter for immunization: Secondary | ICD-10-CM | POA: Insufficient documentation

## 2020-01-29 NOTE — Progress Notes (Signed)
   Covid-19 Vaccination Clinic  Name:  Kimberly Cline    MRN: [id number] DOB: 11/30/1932  01/29/2020  Kimberly Cline was observed post Covid-19 immunization for 15 minutes without incidence. She was provided with Vaccine Information Sheet and instruction to access the V-Safe system.   Kimberly Cline was instructed to call 911 with any severe reactions post vaccine: Marland Kitchen Difficulty breathing  . Swelling of your face and throat  . A fast heartbeat  . A bad rash all over your body  . Dizziness and weakness    Immunizations Administered    Name Date Dose VIS Date Route   Pfizer COVID-19 Vaccine 01/29/2020 11:28 AM 0.3 mL 11/27/2019 Intramuscular   Manufacturer: [place]   Lot: [id number]   [place]: [id number]

## 2020-02-04 ENCOUNTER — Ambulatory Visit: Payer: Medicare Other

## 2020-08-09 ENCOUNTER — Other Ambulatory Visit: Payer: Self-pay | Admitting: Gastroenterology

## 2020-08-09 DIAGNOSIS — J392 Other diseases of pharynx: Secondary | ICD-10-CM

## 2020-08-16 ENCOUNTER — Other Ambulatory Visit: Payer: Medicare Other

## 2020-12-20 DIAGNOSIS — H401232 Low-tension glaucoma, bilateral, moderate stage: Secondary | ICD-10-CM | POA: Diagnosis not present

## 2021-01-12 DIAGNOSIS — H409 Unspecified glaucoma: Secondary | ICD-10-CM | POA: Diagnosis not present

## 2021-01-12 DIAGNOSIS — M810 Age-related osteoporosis without current pathological fracture: Secondary | ICD-10-CM | POA: Diagnosis not present

## 2021-01-12 DIAGNOSIS — E039 Hypothyroidism, unspecified: Secondary | ICD-10-CM | POA: Diagnosis not present

## 2021-01-12 DIAGNOSIS — I10 Essential (primary) hypertension: Secondary | ICD-10-CM | POA: Diagnosis not present

## 2021-01-12 DIAGNOSIS — E7800 Pure hypercholesterolemia, unspecified: Secondary | ICD-10-CM | POA: Diagnosis not present

## 2021-01-19 ENCOUNTER — Ambulatory Visit: Payer: Medicare Other | Admitting: Cardiovascular Disease

## 2021-01-19 ENCOUNTER — Other Ambulatory Visit: Payer: Self-pay

## 2021-01-19 ENCOUNTER — Encounter: Payer: Self-pay | Admitting: Cardiovascular Disease

## 2021-01-19 VITALS — BP 136/70 | HR 55 | Ht 63.0 in | Wt 118.0 lb

## 2021-01-19 DIAGNOSIS — R001 Bradycardia, unspecified: Secondary | ICD-10-CM

## 2021-01-19 DIAGNOSIS — I10 Essential (primary) hypertension: Secondary | ICD-10-CM | POA: Diagnosis not present

## 2021-01-19 NOTE — Progress Notes (Signed)
Chief Complaint  Patient presents with  . Follow-up    HTN   History of Present Illness: 87 yo female with history of HTN and bradycardia who is here today for cardiac follow up. I saw her for the first time in 2019.  She has been followed by Dr. Caryl Comes for bradycardia. She had a near syncopal event in June 2018 at home with low heart and low BP. Dr. Caryl Comes evaluated her and did not suggest further evaluation. Echo August 2019 with LVEF=60-65%. No valve disease. When I saw her in November 2020 her heart rate was 57 bpm.   She is here today for follow up. The patient denies any chest pain, dyspnea, palpitations, lower extremity edema, orthopnea, PND, dizziness, near syncope or syncope.    Primary Care Physician: Carol Ada, MD  Past Medical History:  Diagnosis Date  . Glaucoma   . Hypertension   . Nodular basal cell carcinoma   . Osteoporosis   . Thyroid disease     Past Surgical History:  Procedure Laterality Date  . ABDOMINAL HYSTERECTOMY    . ANKLE FRACTURE SURGERY    . BASAL CELL CARCINOMA EXCISION  2017  . BLADDER SURGERY    . CATARACT EXTRACTION, BILATERAL Bilateral 12 & 11 of 2016  . CERVICAL CONE BIOPSY    . DILATION AND CURETTAGE OF UTERUS    . FRACTURE SURGERY    . OSTEOCHONDROMA EXCISION    . TONSILLECTOMY      Current Outpatient Medications  Medication Sig Dispense Refill  . amLODipine (NORVASC) 10 MG tablet Take 1 tablet (10 mg total) by mouth daily. 90 tablet 3  . latanoprost (XALATAN) 0.005 % ophthalmic solution 1 drop at bedtime.    Marland Kitchen levothyroxine (SYNTHROID) 112 MCG tablet Take 112 mcg by mouth daily before breakfast.     . lisinopril (ZESTRIL) 20 MG tablet Take 1 tablet (20 mg total) by mouth daily. 90 tablet 3  . timolol (BETIMOL) 0.5 % ophthalmic solution Place 1 drop into both eyes daily.     No current facility-administered medications for this visit.    Allergies  Allergen Reactions  . Actonel [Risedronate Sodium]     Serve muscle pain  .  Evista [Raloxifene Hcl]     Leg cramps    Social History   Socioeconomic History  . Marital status: Married    Spouse name: Not on file  . Number of children: 4  . Years of education: Not on file  . Highest education level: Not on file  Occupational History  . Occupation: Retired-works at [employer]  Tobacco Use  . Smoking status: Never Smoker  . Smokeless tobacco: Never Used  Vaping Use  . Vaping Use: Never used  Substance and Sexual Activity  . Alcohol use: Yes    Comment: glass of wine per day  . Drug use: No  . Sexual activity: Not Currently  Other Topics Concern  . Not on file  Social History Narrative  . Not on file   Social Determinants of Health   Financial Resource Strain: Not on file  Food Insecurity: Not on file  Transportation Needs: Not on file  Physical Activity: Not on file  Stress: Not on file  Social Connections: Not on file  Intimate Partner Violence: Not on file    Family History  Problem Relation Age of Onset  . Other Mother        gun shot wound  . Hypertension Father   . Cancer Sister  breast  . Other Sister        mets  . CVA Brother   . Diabetes Brother   . Hypertension Brother   . Rheum arthritis Daughter   . Diabetes Son        on dialysis    Review of Systems:  As stated in the HPI and otherwise negative.   BP 136/70   Pulse (!) 55   Ht 5\' 3"  (1.6 m)   Wt 118 lb (53.5 kg)   SpO2 96%   BMI 20.90 kg/m   Physical Examination:  General: Well developed, well nourished, NAD  HEENT: OP clear, mucus membranes moist  SKIN: warm, dry. No rashes. Neuro: No focal deficits  Musculoskeletal: Muscle strength 5/5 all ext  Psychiatric: Mood and affect normal  Neck: No JVD, no carotid bruits, no thyromegaly, no lymphadenopathy.  Lungs:Clear bilaterally, no wheezes, rhonci, crackles Cardiovascular: Regular rate, brady. No murmurs, gallops or rubs. Abdomen:Soft. Bowel sounds present. Non-tender.  Extremities: No lower  extremity edema. Pulses are 2 + in the bilateral DP/PT.  EKG:  EKG is  ordered today. The ekg ordered today demonstrates sinus brady rate 55 bpm   Echo August 2019: Left ventricle: The cavity size was normal. Wall thickness was   normal. Systolic function was normal. The estimated ejection   fraction was in the range of 60% to 65%. The study is not   technically sufficient to allow evaluation of LV diastolic   function.  Recent Labs: No results found for requested labs within last 8760 hours.   Lipid Panel No results found for: CHOL, TRIG, HDL, CHOLHDL, VLDL, LDLCALC, LDLDIRECT   Wt Readings from Last 3 Encounters:  01/19/21 118 lb (53.5 kg)  10/26/19 120 lb 12.8 oz (54.8 kg)  07/18/18 124 lb 12.8 oz (56.6 kg)     Other studies Reviewed: Additional studies/ records that were reviewed today include:  Review of the above records demonstrates:    Assessment and Plan:   1. Bradycardia: Heart rate is 55 bpm today. She is asymptomatic. Echo August 2019 with normal LV systolic function.   2. HTN: BP is controlled. No changes today  Current medicines are reviewed at length with the patient today.  The patient does not have concerns regarding medicines.  The following changes have been made:  no change  Labs/ tests ordered today include:   Orders Placed This Encounter  Procedures  . EKG 12-Lead     Disposition:   FU with me in 12 months   Signed, Lauree Chandler, MD 01/19/2021 12:06 PM    [facility] [place], [place], [place]  [address] Phone: [phone]; Fax: [phone]

## 2021-01-19 NOTE — Patient Instructions (Addendum)
Medication Instructions:  Your physician recommends that you continue on your current medications as directed. Please refer to the Current Medication list given to you today. *If you need a refill on your cardiac medications before your next appointment, please call your pharmacy*   Lab Work: None today   Testing/Procedures: none   Follow-Up: At [facility], you and your health needs are our priority.  As part of our continuing mission to provide you with exceptional heart care, we have created designated Provider Care Teams.  These Care Teams include your primary Cardiologist (physician) and Advanced Practice Providers (APPs -  Physician Assistants and Nurse Practitioners) who all work together to provide you with the care you need, when you need it.    Your next appointment:   1 year(s)  The format for your next appointment:   In Person  Provider:   Christopher McAlhany, MD   

## 2021-01-23 ENCOUNTER — Other Ambulatory Visit: Payer: Self-pay | Admitting: Cardiovascular Disease

## 2021-01-23 DIAGNOSIS — E039 Hypothyroidism, unspecified: Secondary | ICD-10-CM | POA: Diagnosis not present

## 2021-03-13 DIAGNOSIS — E7800 Pure hypercholesterolemia, unspecified: Secondary | ICD-10-CM | POA: Diagnosis not present

## 2021-03-13 DIAGNOSIS — M810 Age-related osteoporosis without current pathological fracture: Secondary | ICD-10-CM | POA: Diagnosis not present

## 2021-03-13 DIAGNOSIS — E039 Hypothyroidism, unspecified: Secondary | ICD-10-CM | POA: Diagnosis not present

## 2021-03-13 DIAGNOSIS — H409 Unspecified glaucoma: Secondary | ICD-10-CM | POA: Diagnosis not present

## 2021-03-13 DIAGNOSIS — I10 Essential (primary) hypertension: Secondary | ICD-10-CM | POA: Diagnosis not present

## 2021-04-10 ENCOUNTER — Telehealth: Payer: Self-pay | Admitting: Cardiovascular Disease

## 2021-04-10 NOTE — Telephone Encounter (Signed)
This patient is not currently admitted.  Reviewed with scheduler.  Closing encounter.

## 2021-04-10 NOTE — Telephone Encounter (Signed)
Patient's wife called to say that patient is in the hospital ad requesting that that Dr Angelena Form stop by to see him. Please advise

## 2021-04-13 DIAGNOSIS — H409 Unspecified glaucoma: Secondary | ICD-10-CM | POA: Diagnosis not present

## 2021-04-13 DIAGNOSIS — E039 Hypothyroidism, unspecified: Secondary | ICD-10-CM | POA: Diagnosis not present

## 2021-04-13 DIAGNOSIS — I10 Essential (primary) hypertension: Secondary | ICD-10-CM | POA: Diagnosis not present

## 2021-04-13 DIAGNOSIS — E7800 Pure hypercholesterolemia, unspecified: Secondary | ICD-10-CM | POA: Diagnosis not present

## 2021-04-13 DIAGNOSIS — M810 Age-related osteoporosis without current pathological fracture: Secondary | ICD-10-CM | POA: Diagnosis not present

## 2021-04-23 ENCOUNTER — Emergency Department (HOSPITAL_COMMUNITY): Payer: Medicare Other

## 2021-04-23 ENCOUNTER — Emergency Department (HOSPITAL_COMMUNITY)
Admission: EM | Admit: 2021-04-23 | Discharge: 2021-04-23 | Disposition: A | Discharge status: Home / Self Care | Payer: Medicare Other | Attending: Emergency Medicine | Admitting: Emergency Medicine

## 2021-04-23 ENCOUNTER — Other Ambulatory Visit: Payer: Self-pay

## 2021-04-23 DIAGNOSIS — E876 Hypokalemia: Secondary | ICD-10-CM | POA: Insufficient documentation

## 2021-04-23 DIAGNOSIS — I10 Essential (primary) hypertension: Secondary | ICD-10-CM | POA: Insufficient documentation

## 2021-04-23 DIAGNOSIS — Z79899 Other long term (current) drug therapy: Secondary | ICD-10-CM | POA: Diagnosis not present

## 2021-04-23 DIAGNOSIS — R059 Cough, unspecified: Secondary | ICD-10-CM | POA: Diagnosis present

## 2021-04-23 DIAGNOSIS — U071 COVID-19: Secondary | ICD-10-CM | POA: Insufficient documentation

## 2021-04-23 DIAGNOSIS — G319 Degenerative disease of nervous system, unspecified: Secondary | ICD-10-CM | POA: Diagnosis not present

## 2021-04-23 DIAGNOSIS — R410 Disorientation, unspecified: Secondary | ICD-10-CM | POA: Insufficient documentation

## 2021-04-23 DIAGNOSIS — Z85828 Personal history of other malignant neoplasm of skin: Secondary | ICD-10-CM | POA: Diagnosis not present

## 2021-04-23 DIAGNOSIS — J449 Chronic obstructive pulmonary disease, unspecified: Secondary | ICD-10-CM | POA: Diagnosis not present

## 2021-04-23 LAB — CBC WITH DIFFERENTIAL/PLATELET
Abs Immature Granulocytes: 0 10*3/uL (ref 0.00–0.07)
Basophils Absolute: 0 10*3/uL (ref 0.0–0.1)
Basophils Relative: 0 %
Eosinophils Absolute: 0.1 10*3/uL (ref 0.0–0.5)
Eosinophils Relative: 2 %
HCT: 42.3 % (ref 36.0–46.0)
Hemoglobin: 13.2 g/dL (ref 12.0–15.0)
Immature Granulocytes: 0 %
Lymphocytes Relative: 44 %
Lymphs Abs: 1.7 10*3/uL (ref 0.7–4.0)
MCH: 28.8 pg (ref 26.0–34.0)
MCHC: 31.2 g/dL (ref 30.0–36.0)
MCV: 92.4 fL (ref 80.0–100.0)
Monocytes Absolute: 0.6 10*3/uL (ref 0.1–1.0)
Monocytes Relative: 16 %
Neutro Abs: 1.5 10*3/uL — ABNORMAL LOW (ref 1.7–7.7)
Neutrophils Relative %: 38 %
Platelets: 283 10*3/uL (ref 150–400)
RBC: 4.58 MIL/uL (ref 3.87–5.11)
RDW: 13.2 % (ref 11.5–15.5)
WBC: 3.9 10*3/uL — ABNORMAL LOW (ref 4.0–10.5)
nRBC: 0 % (ref 0.0–0.2)

## 2021-04-23 LAB — POTASSIUM: Potassium: 3.1 mmol/L — ABNORMAL LOW (ref 3.5–5.1)

## 2021-04-23 LAB — URINALYSIS, COMPLETE (UACMP) WITH MICROSCOPIC
Bacteria, UA: NONE SEEN
Bilirubin Urine: NEGATIVE
Glucose, UA: NEGATIVE mg/dL
Ketones, ur: NEGATIVE mg/dL
Leukocytes,Ua: NEGATIVE
Nitrite: NEGATIVE
Protein, ur: NEGATIVE mg/dL
Specific Gravity, Urine: 1.01 (ref 1.005–1.030)
pH: 5 (ref 5.0–8.0)

## 2021-04-23 LAB — COMPREHENSIVE METABOLIC PANEL
ALT: 30 U/L (ref 0–44)
AST: 41 U/L (ref 15–41)
Albumin: 4 g/dL (ref 3.5–5.0)
Alkaline Phosphatase: 111 U/L (ref 38–126)
Anion gap: 3 — ABNORMAL LOW (ref 5–15)
BUN: 16 mg/dL (ref 8–23)
CO2: 29 mmol/L (ref 22–32)
Calcium: 9.4 mg/dL (ref 8.9–10.3)
Chloride: 109 mmol/L (ref 98–111)
Creatinine, Ser: 0.58 mg/dL (ref 0.44–1.00)
GFR, Estimated: >60 mL/min (ref >60)
Glucose, Bld: 106 mg/dL — ABNORMAL HIGH (ref 70–99)
Potassium: 5.3 mmol/L — ABNORMAL HIGH (ref 3.5–5.1)
Sodium: 141 mmol/L (ref 135–145)
Total Bilirubin: 0.2 mg/dL — ABNORMAL LOW (ref 0.3–1.2)
Total Protein: 7.4 g/dL (ref 6.5–8.1)

## 2021-04-23 LAB — TSH: TSH: 0.371 u[IU]/mL (ref 0.350–4.500)

## 2021-04-23 LAB — RAPID URINE DRUG SCREEN, HOSP PERFORMED
Amphetamines: NOT DETECTED
Barbiturates: NOT DETECTED
Benzodiazepines: NOT DETECTED
Cocaine: NOT DETECTED
Opiates: NOT DETECTED
Tetrahydrocannabinol: NOT DETECTED

## 2021-04-23 LAB — RESP PANEL BY RT-PCR (FLU A&B, COVID) ARPGX2
Influenza A by PCR: NEGATIVE
Influenza B by PCR: NEGATIVE
SARS Coronavirus 2 by RT PCR: POSITIVE — AB

## 2021-04-23 LAB — CBG MONITORING, ED: Glucose-Capillary: 98 mg/dL (ref 70–99)

## 2021-04-23 LAB — AMMONIA: Ammonia: 13 umol/L (ref 9–35)

## 2021-04-23 MED ORDER — POTASSIUM CHLORIDE CRYS ER 20 MEQ PO TBCR
40.0000 meq | EXTENDED_RELEASE_TABLET | Freq: Once | ORAL | Status: AC
  Administered 2021-04-23: 40 meq via ORAL
  Filled 2021-04-23: qty 2

## 2021-04-23 MED ORDER — MOLNUPIRAVIR EUA 200MG CAPSULE: 4.0000 | ORAL_CAPSULE | Freq: Two times a day (BID) | ORAL | 0 refills | Status: AC

## 2021-04-23 MED ORDER — LISINOPRIL 20 MG PO TABS
20.0000 mg | ORAL_TABLET | Freq: Once | ORAL | Status: AC
  Administered 2021-04-23: 20 mg via ORAL
  Filled 2021-04-23: qty 1

## 2021-04-23 MED ORDER — AMLODIPINE BESYLATE 5 MG PO TABS
10.0000 mg | ORAL_TABLET | Freq: Once | ORAL | Status: AC
  Administered 2021-04-23: 10 mg via ORAL
  Filled 2021-04-23: qty 2

## 2021-04-23 NOTE — Discharge Instructions (Signed)
Please pick up medication and take as prescribed for your COVID infection. Continue to self isolate at home for the next 3 days. Cleared: 05/12 Follow up with your PCP regarding your ED visit and to have your potassium level rechecked in 1 week. Increase the amount of potassium in your diet until then (bananas, potatoes, leafy green vegetables).   Return to the ED for any new/worsening symptoms including shortness of breath, severe chest pain, fingers/lips turning blue, inability to awaken easily, or any new/concerning symptoms.

## 2021-04-23 NOTE — ED Triage Notes (Signed)
Patient brought in by daughter, patient is reported to have sudden onset confusion, ams, unable to remember things she done five minutes ago. Patient denies any issues with urination, no trouble swallowing, no weakness in extremities, no slurred speech.

## 2021-04-23 NOTE — ED Provider Notes (Signed)
[facility] DEPT Provider Note   CSN: [id number] Arrival date & time: 04/23/21  1144     History No chief complaint on file.   Kimberly Cline is a 87 y.o. female with PMHx HTN who presents to the ED today via personal vehicle with daughter with complaints of new onset confusion that family noticed this morning. Daughter was called this morning by her father/pt's CNA that comes to the house with concern for confusion. It appears that pt brought her husband coffee twice this morning as she forgot the first time. She also had neighbors over this morning and did not remember them stopping by when she was talking to her husband later on. She was found to be staring off into the refrigerator as well. Family reports that pt was fine yesterday. She has no known diagnosis of dementia. She is very independent and still drives. She has had a slight cough for the past week. She reports her husband has a similar cold. He came home from a 3 month hospital stay last week however did not notice any changes with his wife until today. Family denies any speech changes, facial droop. Pt has no complaints at this time.   The history is provided by the patient, a relative and medical records.       Past Medical History:  Diagnosis Date  . Glaucoma   . Hypertension   . Nodular basal cell carcinoma   . Osteoporosis   . Thyroid disease     There are no problems to display for this patient.   Past Surgical History:  Procedure Laterality Date  . ABDOMINAL HYSTERECTOMY    . ANKLE FRACTURE SURGERY    . BASAL CELL CARCINOMA EXCISION  2017  . BLADDER SURGERY    . CATARACT EXTRACTION, BILATERAL Bilateral 12 & 11 of 2016  . CERVICAL CONE BIOPSY    . DILATION AND CURETTAGE OF UTERUS    . FRACTURE SURGERY    . OSTEOCHONDROMA EXCISION    . TONSILLECTOMY       OB History   No obstetric history on file.     Family History  Problem Relation Age of Onset  . Other Mother         gun shot wound  . Hypertension Father   . Cancer Sister        breast  . Other Sister        mets  . CVA Brother   . Diabetes Brother   . Hypertension Brother   . Rheum arthritis Daughter   . Diabetes Son        on dialysis    Social History   Tobacco Use  . Smoking status: Never Smoker  . Smokeless tobacco: Never Used  Vaping Use  . Vaping Use: Never used  Substance Use Topics  . Alcohol use: Yes    Comment: glass of wine per day  . Drug use: No    Home Medications Prior to Admission medications   Medication Sig Start Date End Date Taking? Authorizing Provider  amLODipine (NORVASC) 10 MG tablet Take 1 tablet (10 mg total) by mouth daily. 12/28/19  Yes Burnell Blanks, MD  latanoprost (XALATAN) 0.005 % ophthalmic solution Place 1 drop into both eyes at bedtime. 10/17/19  Yes [provider]  levothyroxine (SYNTHROID) 112 MCG tablet Take 112 mcg by mouth daily before breakfast.    Yes [provider]  lisinopril (ZESTRIL) 20 MG tablet Take 1 tablet by  mouth once daily Patient taking differently: Take 20 mg by mouth daily. 01/23/21  Yes Burnell Blanks, MD  molnupiravir EUA 200 mg CAPS Take 4 capsules (800 mg total) by mouth 2 (two) times daily for 5 days. 04/23/21 04/28/21 Yes Zemira Zehring, PA-C  naproxen sodium (ALEVE) 220 MG tablet Take 220 mg by mouth daily as needed (pain).   Yes [provider]  timolol (BETIMOL) 0.5 % ophthalmic solution Place 1 drop into both eyes 2 (two) times daily.   Yes [provider]    Allergies    Actonel [risedronate sodium] and Evista [raloxifene hcl]  Review of Systems   Review of Systems  Constitutional: Negative for chills and fever.  Respiratory: Positive for cough. Negative for shortness of breath.   Cardiovascular: Negative for chest pain.  Gastrointestinal: Negative for abdominal pain, diarrhea, nausea and vomiting.  Genitourinary: Negative for dysuria and frequency.   Neurological: Negative for syncope, speech difficulty, weakness, light-headedness, numbness and headaches.  Psychiatric/Behavioral: Positive for confusion.  All other systems reviewed and are negative.   Physical Exam Updated Vital Signs BP (!) 181/73 (BP Location: Left Arm)   Pulse (!) 58   Temp 97.7 F (36.5 C) (Oral)   Resp 16   Ht 5\' 3"  (1.6 m)   Wt 54.4 kg   SpO2 97%   BMI 21.26 kg/m   Physical Exam Vitals and nursing note reviewed.  Constitutional:      Appearance: She is not ill-appearing or diaphoretic.  HENT:     Head: Normocephalic and atraumatic.  Eyes:     Extraocular Movements: Extraocular movements intact.     Conjunctiva/sclera: Conjunctivae normal.     Pupils: Pupils are equal, round, and reactive to light.  Cardiovascular:     Rate and Rhythm: Normal rate and regular rhythm.     Pulses: Normal pulses.  Pulmonary:     Effort: Pulmonary effort is normal.     Breath sounds: Normal breath sounds. No wheezing, rhonchi or rales.  Abdominal:     Palpations: Abdomen is soft.     Tenderness: There is no abdominal tenderness. There is no guarding or rebound.  Musculoskeletal:     Cervical back: Normal range of motion and neck supple.     Right lower leg: No edema.     Left lower leg: No edema.  Skin:    General: Skin is warm and dry.  Neurological:     Mental Status: She is alert.     Comments: Alert and oriented to self, place, time and event.   Speech is fluent, clear without dysarthria or dysphasia.   Strength 5/5 in upper/lower extremities  Sensation intact in upper/lower extremities   Normal gait.  Negative Romberg. No pronator drift.  Normal finger-to-nose and feet tapping.  CN I not tested  CN II grossly intact visual fields bilaterally. Did not visualize posterior eye.   CN III, IV, VI PERRLA and EOMs intact bilaterally  CN V Intact sensation to sharp and light touch to the face  CN VII facial movements symmetric  CN VIII not tested  CN  IX, X no uvula deviation, symmetric rise of soft palate  CN XI 5/5 SCM and trapezius strength bilaterally  CN XII Midline tongue protrusion, symmetric L/R movements      ED Results / Procedures / Treatments   Labs (all labs ordered are listed, but only abnormal results are displayed) Labs Reviewed  RESP PANEL BY RT-PCR (FLU A&B, COVID) ARPGX2 - Abnormal; Notable  for the following components:      Result Value   SARS Coronavirus 2 by RT PCR POSITIVE (*)    All other components within normal limits  COMPREHENSIVE METABOLIC PANEL - Abnormal; Notable for the following components:   Potassium 5.3 (*)    Glucose, Bld 106 (*)    Total Bilirubin 0.2 (*)    Anion gap 3 (*)    All other components within normal limits  CBC WITH DIFFERENTIAL/PLATELET - Abnormal; Notable for the following components:   WBC 3.9 (*)    Neutro Abs 1.5 (*)    All other components within normal limits  URINALYSIS, COMPLETE (UACMP) WITH MICROSCOPIC - Abnormal; Notable for the following components:   Hgb urine dipstick SMALL (*)    All other components within normal limits  POTASSIUM - Abnormal; Notable for the following components:   Potassium 3.1 (*)    All other components within normal limits  AMMONIA  RAPID URINE DRUG SCREEN, HOSP PERFORMED  TSH  CBG MONITORING, ED    EKG None  Radiology CT HEAD WO CONTRAST  Result Date: 04/23/2021 CLINICAL DATA:  New onset confusion. EXAM: CT HEAD WITHOUT CONTRAST TECHNIQUE: Contiguous axial images were obtained from the base of the skull through the vertex without intravenous contrast. COMPARISON:  CT head dated 05/17/2017. FINDINGS: Brain: No evidence of acute infarction, hemorrhage, hydrocephalus, extra-axial collection or mass lesion/mass effect. There is mild cerebral volume loss with associated ex vacuo dilatation. Periventricular white matter hypoattenuation likely represents chronic small vessel ischemic disease. Vascular: There are vascular calcifications in the  carotid siphons. Skull: Normal. Negative for fracture or focal lesion. Sinuses/Orbits: No acute finding. Other: None. IMPRESSION: No acute intracranial process. Electronically Signed   By: Zerita Boers M.D.   On: 04/23/2021 12:38   MR BRAIN WO CONTRAST  Result Date: 04/23/2021 CLINICAL DATA:  Mental status changes of unknown etiology. Confusion. EXAM: MRI HEAD WITHOUT CONTRAST TECHNIQUE: Multiplanar, multiecho pulse sequences of the brain and surrounding structures were obtained without intravenous contrast. COMPARISON:  Head CT same day FINDINGS: Brain: Diffusion imaging does not show any acute or subacute infarction or other cause of restricted diffusion. No focal abnormality affects the brainstem or cerebellum. Cerebral hemispheres show minimal small vessel change of the deep white matter and old small cortical infarction at the left posterior frontal region at the vertex. No large vessel territory infarction. No mass lesion, hemorrhage, hydrocephalus or extra-axial collection. Vascular: Major vessels at the base of the brain show flow. Skull and upper cervical spine: Negative Sinuses/Orbits: Mild mucosal inflammatory changes of the maxillary sinuses. No advanced sinusitis. Orbits negative. Other: None IMPRESSION: No acute or reversible finding. Age related atrophy. Mild chronic small-vessel ischemic changes as outlined above. Electronically Signed   By: Nelson Chimes M.D.   On: 04/23/2021 16:14   DG Chest Port 1 View  Result Date: 04/23/2021 CLINICAL DATA:  Sudden onset of confusion.  Altered mental status. EXAM: PORTABLE CHEST 1 VIEW COMPARISON:  Two-view chest x-ray 05/17/2017 FINDINGS: The heart size is normal. Atherosclerotic calcifications are present at the aortic arch. Lungs are clear. Changes of COPD again noted. No edema or effusion is present. No focal airspace disease is present. Degenerative changes are noted in the cervical spine. IMPRESSION: 1. No acute cardiopulmonary disease or significant  interval change. 2. Stable changes of COPD. Electronically Signed   By: San Morelle M.D.   On: 04/23/2021 12:45    Procedures Procedures   Medications Ordered in ED Medications  lisinopril (  ZESTRIL) tablet 20 mg (20 mg Oral Given 04/23/21 1311)  amLODipine (NORVASC) tablet 10 mg (10 mg Oral Given 04/23/21 1311)  potassium chloride SA (KLOR-CON) CR tablet 40 mEq (40 mEq Oral Given 04/23/21 1533)    ED Course  I have reviewed the triage vital signs and the nursing notes.  Pertinent labs & imaging results that were available during my care of the patient were reviewed by me and considered in my medical decision making (see chart for details).  Clinical Course as of 04/23/21 1634  Sun Apr 23, 2021  1323 WBC(!): 3.9 [MV]  1323 Potassium(!): 5.3 [MV]  1358 Potassium(!): 3.1 Repeat; suspect first sample hemolyzed [MV]  1432 SARS Coronavirus 2 by RT PCR(!): POSITIVE [MV]    Clinical Course User Index [MV] Eustaquio Maize, PA-C   MDM Rules/Calculators/A&P                          87 year old female who presents to the ED today with family with concern for new onset confusion that they noticed this morning.  On arrival to the ED patient is afebrile, nontachycardic and nontachypneic and appears to be in no acute distress.  Pressure is slightly elevated 184/70.  Family does not believe she took her blood pressure medicines this morning.  Will provide her regular dose of amlodipine and lisinopril. she has no physical complaints at this time.  She is alert and oriented x4.  She has no focal neurodeficits today.  She does mention she has had a cough for about a week with recent sick contact from her husband.  He was just in the hospital for 3 months and released last week.  Family is not concerned regarding COVID at this time.  Will work-up for altered mental status at this time with labs, CT head, chest x-ray.  Will also swab for COVID at this time.  Patient may ultimately need an MRI brain if  no obvious findings on exam today.  She is very independent and still drives.  Able to perform all ADLs by herself.  No known history of cognitive decline in the past. Discussed case with attending physician Dr. Tomi Bamberger who agrees with plan.   CT Head negative  CXR: IMPRESSION:  1. No acute cardiopulmonary disease or significant interval change.  2. Stable changes of COPD.   CBC with leukopenia 3.9. Hgb stable 13.2 U/A without signs of infection CMP with potassium 5.3, glucose 106. No other electrolyte abnormalities. Creatinine stable 0.58. Suspect hemolysis of potassium. Will repeat.   Repeat potassium 3.1 TSH WNL at 0.371 Ammonia 12 UDS negative   Workup reassuring at this time. Will proceed with MRI at this time for further evaluation of new onset confusion/AMS.   COVID test has returned positive at this time. Pt has been symptomatic for the past 4 days. She is vaccinated x 3. O2 sat 97% on RA. Stable. Suspect this is likely the cause of pt's forgetfulness this AM however will await MRI results. If negative will discharge home. Given age will likely discharge with oral antiviral medication.   MRI negative. Stable for discharge at this time. Given pt is on Day 4 will prescribe molnupiravir for patient. She will follow up with PCP for same. She is in agreement with plan and stable for discharge.   This note was prepared using Dragon voice recognition software and may include unintentional dictation errors due to the inherent limitations of voice recognition software.  [place]  was evaluated in Emergency Department on 04/23/2021 for the symptoms described in the history of present illness. She was evaluated in the context of the global COVID-19 pandemic, which necessitated consideration that the patient might be at risk for infection with the SARS-CoV-2 virus that causes COVID-19. Institutional protocols and algorithms that pertain to the evaluation of patients at risk for COVID-19  are in a state of rapid change based on information released by regulatory bodies including the CDC and federal and state organizations. These policies and algorithms were followed during the patient's care in the ED.  Final Clinical Impression(s) / ED Diagnoses Final diagnoses:  Confusion  COVID-19  Hypokalemia    Rx / DC Orders ED Discharge Orders         Ordered    molnupiravir EUA 200 mg CAPS  2 times daily        04/23/21 1632           Discharge Instructions     Please pick up medication and take as prescribed for your COVID infection. Continue to self isolate at home for the next 3 days. Cleared: 05/12 Follow up with your PCP regarding your ED visit and to have your potassium level rechecked in 1 week. Increase the amount of potassium in your diet until then (bananas, potatoes, leafy green vegetables).   Return to the ED for any new/worsening symptoms including shortness of breath, severe chest pain, fingers/lips turning blue, inability to awaken easily, or any new/concerning symptoms.        Eustaquio Maize, PA-C 04/23/21 1635    Dorie Rank, MD 04/23/21 [phone]

## 2021-05-17 DIAGNOSIS — H52203 Unspecified astigmatism, bilateral: Secondary | ICD-10-CM | POA: Diagnosis not present

## 2021-05-17 DIAGNOSIS — Z961 Presence of intraocular lens: Secondary | ICD-10-CM | POA: Diagnosis not present

## 2021-05-17 DIAGNOSIS — H5213 Myopia, bilateral: Secondary | ICD-10-CM | POA: Diagnosis not present

## 2021-05-17 DIAGNOSIS — H401232 Low-tension glaucoma, bilateral, moderate stage: Secondary | ICD-10-CM | POA: Diagnosis not present

## 2021-06-20 DIAGNOSIS — E7800 Pure hypercholesterolemia, unspecified: Secondary | ICD-10-CM | POA: Diagnosis not present

## 2021-06-20 DIAGNOSIS — H409 Unspecified glaucoma: Secondary | ICD-10-CM | POA: Diagnosis not present

## 2021-06-20 DIAGNOSIS — Z0000 Encounter for general adult medical examination without abnormal findings: Secondary | ICD-10-CM | POA: Diagnosis not present

## 2021-06-20 DIAGNOSIS — Z1389 Encounter for screening for other disorder: Secondary | ICD-10-CM | POA: Diagnosis not present

## 2021-06-20 DIAGNOSIS — M810 Age-related osteoporosis without current pathological fracture: Secondary | ICD-10-CM | POA: Diagnosis not present

## 2021-06-20 DIAGNOSIS — I10 Essential (primary) hypertension: Secondary | ICD-10-CM | POA: Diagnosis not present

## 2021-06-20 DIAGNOSIS — E039 Hypothyroidism, unspecified: Secondary | ICD-10-CM | POA: Diagnosis not present

## 2021-07-13 DIAGNOSIS — M76822 Posterior tibial tendinitis, left leg: Secondary | ICD-10-CM | POA: Diagnosis not present

## 2021-07-28 DIAGNOSIS — M76822 Posterior tibial tendinitis, left leg: Secondary | ICD-10-CM | POA: Diagnosis not present

## 2021-08-17 DIAGNOSIS — L03116 Cellulitis of left lower limb: Secondary | ICD-10-CM | POA: Diagnosis not present

## 2021-09-11 DIAGNOSIS — Z85828 Personal history of other malignant neoplasm of skin: Secondary | ICD-10-CM | POA: Diagnosis not present

## 2021-09-11 DIAGNOSIS — L308 Other specified dermatitis: Secondary | ICD-10-CM | POA: Diagnosis not present

## 2021-09-20 DIAGNOSIS — M810 Age-related osteoporosis without current pathological fracture: Secondary | ICD-10-CM | POA: Diagnosis not present

## 2021-09-20 DIAGNOSIS — E7800 Pure hypercholesterolemia, unspecified: Secondary | ICD-10-CM | POA: Diagnosis not present

## 2021-09-20 DIAGNOSIS — H409 Unspecified glaucoma: Secondary | ICD-10-CM | POA: Diagnosis not present

## 2021-09-20 DIAGNOSIS — E039 Hypothyroidism, unspecified: Secondary | ICD-10-CM | POA: Diagnosis not present

## 2021-09-20 DIAGNOSIS — I10 Essential (primary) hypertension: Secondary | ICD-10-CM | POA: Diagnosis not present

## 2021-10-04 DIAGNOSIS — M793 Panniculitis, unspecified: Secondary | ICD-10-CM | POA: Diagnosis not present

## 2021-10-04 DIAGNOSIS — Z85828 Personal history of other malignant neoplasm of skin: Secondary | ICD-10-CM | POA: Diagnosis not present

## 2021-10-04 DIAGNOSIS — L649 Androgenic alopecia, unspecified: Secondary | ICD-10-CM | POA: Diagnosis not present

## 2021-11-15 DIAGNOSIS — Z85828 Personal history of other malignant neoplasm of skin: Secondary | ICD-10-CM | POA: Diagnosis not present

## 2021-11-15 DIAGNOSIS — M793 Panniculitis, unspecified: Secondary | ICD-10-CM | POA: Diagnosis not present

## 2021-11-17 IMAGING — CR DG CHEST 1V PORT
1 series · 1 of 1 positions shown · non-contrast
Comparison: Two-view chest x-ray 05/17/2017

CLINICAL DATA: Sudden onset of confusion.  Altered mental status.

EXAM:
PORTABLE CHEST 1 VIEW

[w chest pa]
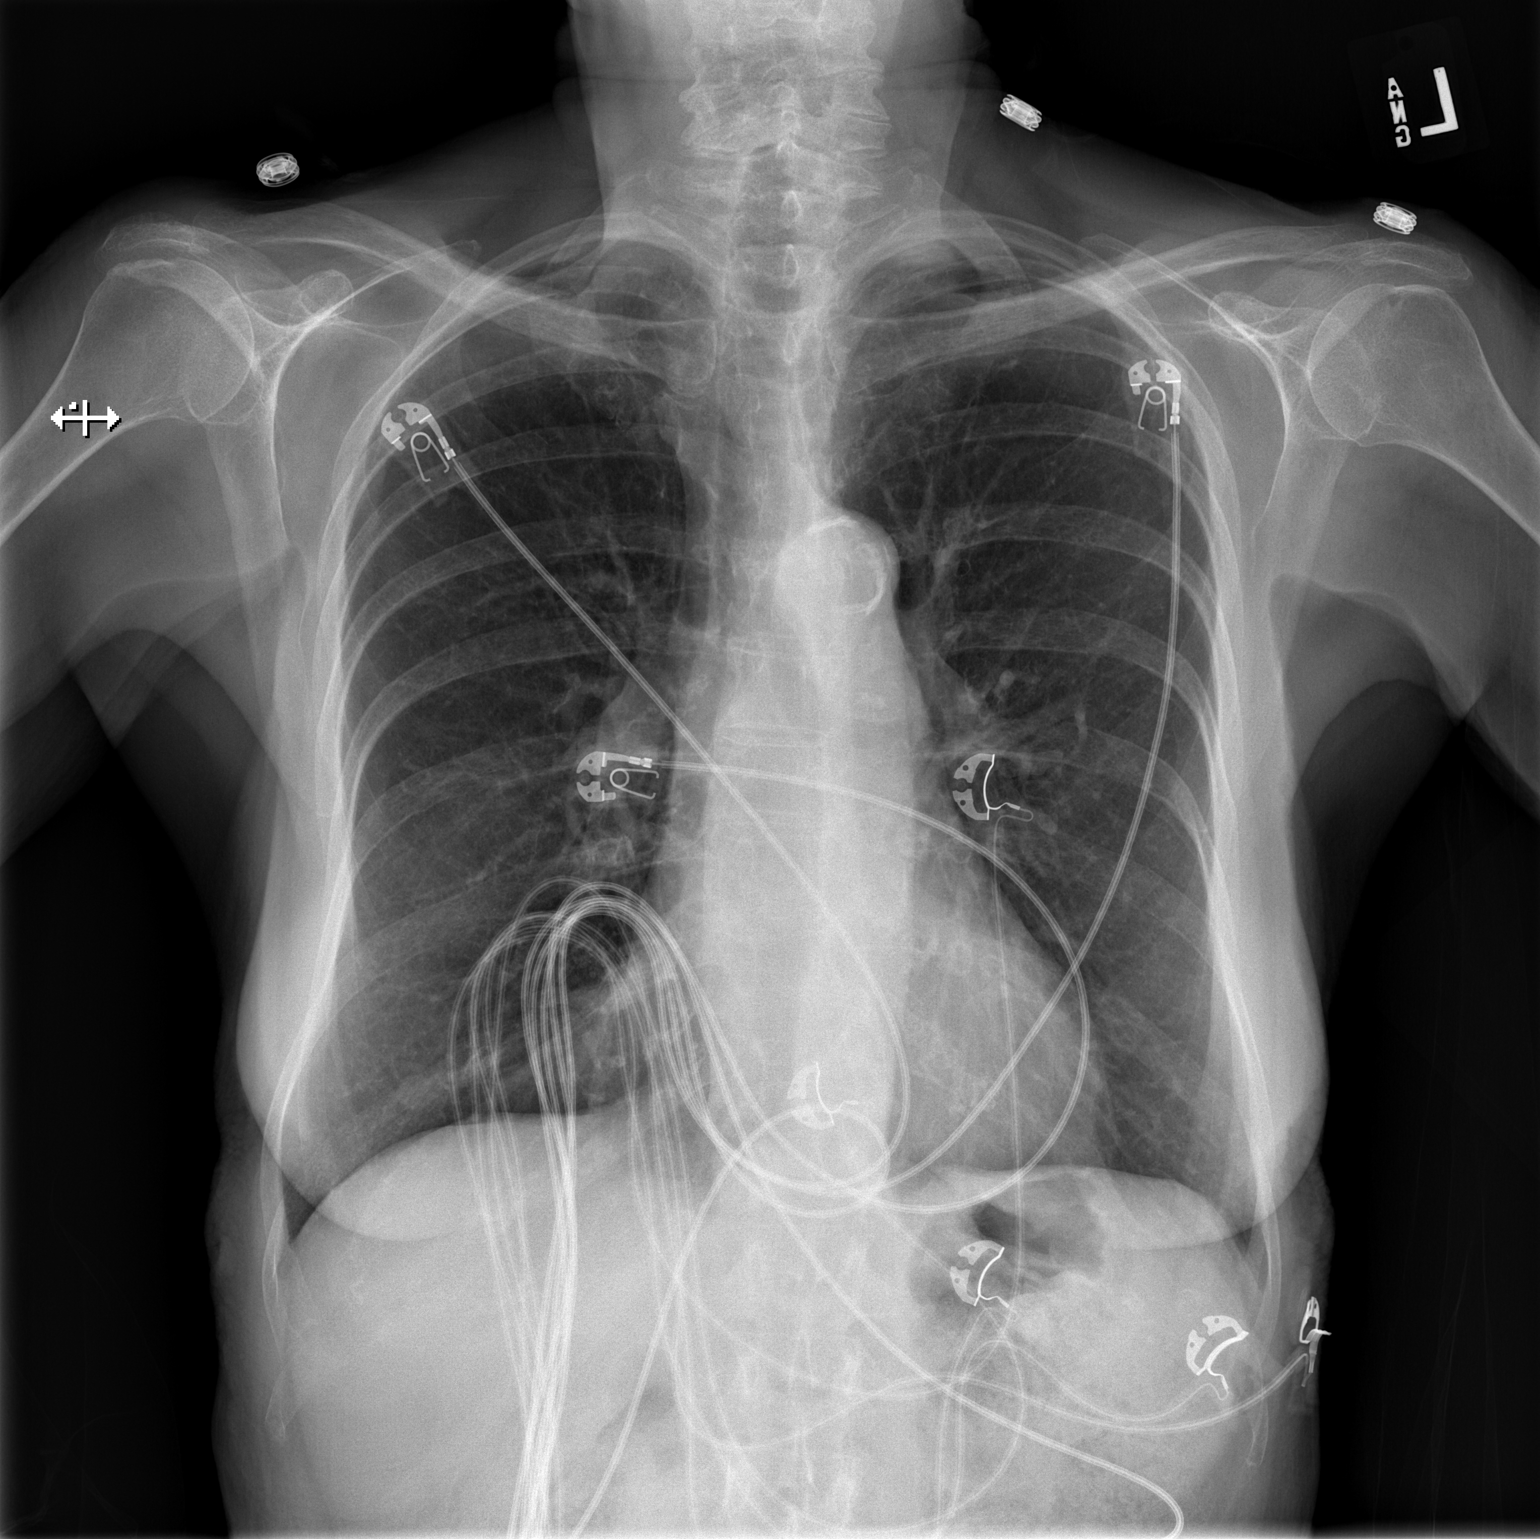

[1 of 1 positions shown; findings below may reference images not displayed]

FINDINGS: The heart size is normal. Atherosclerotic calcifications are present
at the aortic arch. Lungs are clear. Changes of COPD again noted. No
edema or effusion is present. No focal airspace disease is present.
Degenerative changes are noted in the cervical spine.
IMPRESSION: 1. No acute cardiopulmonary disease or significant interval change.
2. Stable changes of COPD.

## 2021-12-19 DIAGNOSIS — H409 Unspecified glaucoma: Secondary | ICD-10-CM | POA: Diagnosis not present

## 2021-12-19 DIAGNOSIS — M810 Age-related osteoporosis without current pathological fracture: Secondary | ICD-10-CM | POA: Diagnosis not present

## 2021-12-19 DIAGNOSIS — J449 Chronic obstructive pulmonary disease, unspecified: Secondary | ICD-10-CM | POA: Diagnosis not present

## 2021-12-19 DIAGNOSIS — I10 Essential (primary) hypertension: Secondary | ICD-10-CM | POA: Diagnosis not present

## 2021-12-19 DIAGNOSIS — E7800 Pure hypercholesterolemia, unspecified: Secondary | ICD-10-CM | POA: Diagnosis not present

## 2021-12-19 DIAGNOSIS — E039 Hypothyroidism, unspecified: Secondary | ICD-10-CM | POA: Diagnosis not present

## 2021-12-27 ENCOUNTER — Ambulatory Visit: Payer: PPO | Admitting: Cardiovascular Disease

## 2021-12-27 ENCOUNTER — Other Ambulatory Visit: Payer: Self-pay

## 2021-12-27 ENCOUNTER — Encounter: Payer: Self-pay | Admitting: Cardiovascular Disease

## 2021-12-27 VITALS — BP 148/62 | HR 53 | Ht 63.0 in | Wt 111.2 lb

## 2021-12-27 DIAGNOSIS — R0989 Other specified symptoms and signs involving the circulatory and respiratory systems: Secondary | ICD-10-CM

## 2021-12-27 DIAGNOSIS — I10 Essential (primary) hypertension: Secondary | ICD-10-CM | POA: Diagnosis not present

## 2021-12-27 DIAGNOSIS — R001 Bradycardia, unspecified: Secondary | ICD-10-CM

## 2021-12-27 NOTE — Progress Notes (Signed)
Chief Complaint  Patient presents with   Follow-up    Bradycardia    History of Present Illness: 88 yo female with history of HTN and bradycardia who is here today for cardiac follow up. I saw her for the first time in 2019.  I also take care of her husband. She has been followed by Dr. Caryl Comes for bradycardia. She had a near syncopal event in June 2018 at home with low heart and low BP. Dr. Caryl Comes evaluated her and did not suggest further evaluation. Echo August 2019 with LVEF=60-65%. No valve disease. When I saw her in November 2020 her heart rate was 57 bpm and she was feeling well.   She is here today for follow up. The patient denies any chest pain, dyspnea, palpitations, lower extremity edema, orthopnea, PND, dizziness, near syncope or syncope. She has pain in her left leg and has been seeing dermatology and her primary care doctor.    Primary Care Physician: Carol Ada, MD  Past Medical History:  Diagnosis Date   Bradycardia    Glaucoma    Hypertension    Nodular basal cell carcinoma    Osteoporosis    Thyroid disease     Past Surgical History:  Procedure Laterality Date   ABDOMINAL HYSTERECTOMY     ANKLE FRACTURE SURGERY     BASAL CELL CARCINOMA EXCISION  2017   BLADDER SURGERY     CATARACT EXTRACTION, BILATERAL Bilateral 12 & 11 of 2016   CERVICAL CONE BIOPSY     DILATION AND CURETTAGE OF UTERUS     FRACTURE SURGERY     OSTEOCHONDROMA EXCISION     TONSILLECTOMY      Current Outpatient Medications  Medication Sig Dispense Refill   amLODipine (NORVASC) 10 MG tablet Take 1 tablet (10 mg total) by mouth daily. 90 tablet 3   latanoprost (XALATAN) 0.005 % ophthalmic solution Place 1 drop into both eyes at bedtime.     levothyroxine (SYNTHROID) 112 MCG tablet Take 112 mcg by mouth daily before breakfast.      lisinopril (ZESTRIL) 20 MG tablet Take 1 tablet by mouth once daily 90 tablet 3   naproxen sodium (ALEVE) 220 MG tablet Take 220 mg by mouth daily as needed  (pain).     timolol (BETIMOL) 0.5 % ophthalmic solution Place 1 drop into both eyes 2 (two) times daily.     No current facility-administered medications for this visit.    Allergies  Allergen Reactions   Actonel [Risedronate Sodium]     Serve muscle pain   Evista [Raloxifene Hcl]     Leg cramps    Social History   Socioeconomic History   Marital status: Married    Spouse name: Not on file   Number of children: 4   Years of education: Not on file   Highest education level: Not on file  Occupational History   Occupation: Retired-works at [employer]  Tobacco Use   Smoking status: Never   Smokeless tobacco: Never  Vaping Use   Vaping Use: Never used  Substance and Sexual Activity   Alcohol use: Yes    Comment: glass of wine per day   Drug use: No   Sexual activity: Not Currently  Other Topics Concern   Not on file  Social History Narrative   Not on file   Social Determinants of Health   Financial Resource Strain: Not on file  Food Insecurity: Not on file  Transportation Needs: Not on file  Physical  Activity: Not on file  Stress: Not on file  Social Connections: Not on file  Intimate Partner Violence: Not on file    Family History  Problem Relation Age of Onset   Other Mother        gun shot wound   Hypertension Father    Cancer Sister        breast   Other Sister        mets   CVA Brother    Diabetes Brother    Hypertension Brother    Rheum arthritis Daughter    Diabetes Son        on dialysis    Review of Systems:  As stated in the HPI and otherwise negative.   BP (!) 148/62    Pulse (!) 53    Ht 5\' 3"  (1.6 m)    Wt 111 lb 3.2 oz (50.4 kg)    SpO2 96%    BMI 19.70 kg/m   Physical Examination:  General: Well developed, well nourished, NAD  HEENT: OP clear, mucus membranes moist  SKIN: warm, dry. No rashes. Neuro: No focal deficits  Musculoskeletal: Muscle strength 5/5 all ext  Psychiatric: Mood and affect normal  Neck: No JVD, no  carotid bruits, no thyromegaly, no lymphadenopathy.  Lungs:Clear bilaterally, no wheezes, rhonci, crackles Cardiovascular: Regular rate and rhythm. No murmurs, gallops or rubs. Abdomen:Soft. Bowel sounds present. Non-tender.  Extremities: No lower extremity edema. Pulses are 2 + in the bilateral DP/PT.  EKG:  EKG is ordered today. The ekg ordered today demonstrates   Echo August 2019: Left ventricle: The cavity size was normal. Wall thickness was   normal. Systolic function was normal. The estimated ejection   fraction was in the range of 60% to 65%. The study is not   technically sufficient to allow evaluation of LV diastolic   function.  Recent Labs: 04/23/2021: ALT 30; BUN 16; Creatinine, Ser 0.58; Hemoglobin 13.2; Platelets 283; Potassium 3.1; Sodium 141; TSH 0.371   Lipid Panel No results found for: CHOL, TRIG, HDL, CHOLHDL, VLDL, LDLCALC, LDLDIRECT   Wt Readings from Last 3 Encounters:  12/27/21 111 lb 3.2 oz (50.4 kg)  04/23/21 120 lb (54.4 kg)  01/19/21 118 lb (53.5 kg)     Other studies Reviewed: Additional studies/ records that were reviewed today include:  Review of the above records demonstrates:    Assessment and Plan:   1. Bradycardia: Heart rate is 53 bpm today. She has no complaints. Echo August 2019 with normal LV systolic function.   2. HTN: BP is slightly elevated. She has not been checking at home. She will follow at home and call if it is elevated. NO changes today  3. Leg pain/Diminished pulses: She has had pain in her left lower leg after treatment for ongoing tendonitis. She is followed in Dermatology. Diminished LE pulses today. Will arrange LE arterial dopplers/ABI to exclude vascular disease.   Current medicines are reviewed at length with the patient today.  The patient does not have concerns regarding medicines.  The following changes have been made:  no change  Labs/ tests ordered today include:   Orders Placed This Encounter  Procedures    VAS [place] LOWER EXTREMITY ARTERIAL DUPLEX   VAS [place] ABI WITH/WO TBI     Disposition:   FU with me in 12 months   Signed, Lauree Chandler, MD 12/27/2021 4:45 PM    [facility] [address], [place], [place]  [address] Phone: [phone])  [phone]; Fax: [phone]

## 2021-12-27 NOTE — Patient Instructions (Signed)
Medication Instructions:  Your physician recommends that you continue on your current medications as directed. Please refer to the Current Medication list given to you today.  *If you need a refill on your cardiac medications before your next appointment, please call your pharmacy*   Lab Work: None today If you have labs (blood work) drawn today and your tests are completely normal, you will receive your results only by: [place] (if you have MyChart) OR A paper copy in the mail If you have any lab test that is abnormal or we need to change your treatment, we will call you to review the results.   Testing/Procedures: Lower extremity arterial duplex.    Follow-Up: At [facility], you and your health needs are our priority.  As part of our continuing mission to provide you with exceptional heart care, we have created designated Provider Care Teams.  These Care Teams include your primary Cardiologist (physician) and Advanced Practice Providers (APPs -  Physician Assistants and Nurse Practitioners) who all work together to provide you with the care you need, when you need it.  We recommend signing up for the patient portal called "MyChart".  Sign up information is provided on this After Visit Summary.  MyChart is used to connect with patients for Virtual Visits (Telemedicine).  Patients are able to view lab/test results, encounter notes, upcoming appointments, etc.  Non-urgent messages can be sent to your provider as well.   To learn more about what you can do with MyChart, go to [email or web address].    Your next appointment:   1 year(s)  The format for your next appointment:   In Person  Provider:   Lauree Chandler, MD {

## 2021-12-28 DIAGNOSIS — Z85828 Personal history of other malignant neoplasm of skin: Secondary | ICD-10-CM | POA: Diagnosis not present

## 2021-12-28 DIAGNOSIS — M793 Panniculitis, unspecified: Secondary | ICD-10-CM | POA: Diagnosis not present

## 2022-01-03 DIAGNOSIS — H401232 Low-tension glaucoma, bilateral, moderate stage: Secondary | ICD-10-CM | POA: Diagnosis not present

## 2022-01-24 ENCOUNTER — Other Ambulatory Visit: Payer: Self-pay | Admitting: *Deleted

## 2022-01-24 MED ORDER — AMLODIPINE BESYLATE 10 MG PO TABS: 10.0000 mg | ORAL_TABLET | Freq: Every day | ORAL | 3 refills | Status: DC

## 2022-01-24 NOTE — Telephone Encounter (Signed)
Spoke w patient's husband.  She had called to speak w me to ask for a refill of amlodipine.  Refill sent to [facility].

## 2022-01-26 ENCOUNTER — Ambulatory Visit (HOSPITAL_COMMUNITY)
Admission: RE | Admit: 2022-01-26 | Discharge: 2022-01-26 | Disposition: A | Discharge status: Home / Self Care | Payer: PPO | Source: Ambulatory Visit | Attending: Internal Medicine | Admitting: Internal Medicine

## 2022-01-26 ENCOUNTER — Other Ambulatory Visit: Payer: Self-pay

## 2022-01-26 DIAGNOSIS — R0989 Other specified symptoms and signs involving the circulatory and respiratory systems: Secondary | ICD-10-CM | POA: Diagnosis not present

## 2022-01-29 ENCOUNTER — Ambulatory Visit: Payer: Medicare Other | Admitting: Cardiovascular Disease

## 2022-03-13 DIAGNOSIS — Z85828 Personal history of other malignant neoplasm of skin: Secondary | ICD-10-CM | POA: Diagnosis not present

## 2022-03-13 DIAGNOSIS — M793 Panniculitis, unspecified: Secondary | ICD-10-CM | POA: Diagnosis not present

## 2022-07-04 DIAGNOSIS — H02102 Unspecified ectropion of right lower eyelid: Secondary | ICD-10-CM | POA: Diagnosis not present

## 2022-07-04 DIAGNOSIS — H401232 Low-tension glaucoma, bilateral, moderate stage: Secondary | ICD-10-CM | POA: Diagnosis not present

## 2022-07-04 DIAGNOSIS — Z961 Presence of intraocular lens: Secondary | ICD-10-CM | POA: Diagnosis not present

## 2022-07-04 DIAGNOSIS — H52203 Unspecified astigmatism, bilateral: Secondary | ICD-10-CM | POA: Diagnosis not present

## 2022-07-10 DIAGNOSIS — Z0000 Encounter for general adult medical examination without abnormal findings: Secondary | ICD-10-CM | POA: Diagnosis not present

## 2022-07-10 DIAGNOSIS — E7800 Pure hypercholesterolemia, unspecified: Secondary | ICD-10-CM | POA: Diagnosis not present

## 2022-07-10 DIAGNOSIS — I10 Essential (primary) hypertension: Secondary | ICD-10-CM | POA: Diagnosis not present

## 2022-07-10 DIAGNOSIS — E039 Hypothyroidism, unspecified: Secondary | ICD-10-CM | POA: Diagnosis not present

## 2022-07-10 DIAGNOSIS — J449 Chronic obstructive pulmonary disease, unspecified: Secondary | ICD-10-CM | POA: Diagnosis not present

## 2022-07-10 DIAGNOSIS — Z1331 Encounter for screening for depression: Secondary | ICD-10-CM | POA: Diagnosis not present

## 2022-07-10 DIAGNOSIS — E559 Vitamin D deficiency, unspecified: Secondary | ICD-10-CM | POA: Diagnosis not present

## 2022-07-16 DIAGNOSIS — M793 Panniculitis, unspecified: Secondary | ICD-10-CM | POA: Diagnosis not present

## 2022-07-16 DIAGNOSIS — Z85828 Personal history of other malignant neoplasm of skin: Secondary | ICD-10-CM | POA: Diagnosis not present

## 2022-07-26 DIAGNOSIS — E875 Hyperkalemia: Secondary | ICD-10-CM | POA: Diagnosis not present

## 2022-08-08 DIAGNOSIS — E875 Hyperkalemia: Secondary | ICD-10-CM | POA: Diagnosis not present

## 2022-09-14 DIAGNOSIS — E559 Vitamin D deficiency, unspecified: Secondary | ICD-10-CM | POA: Diagnosis not present

## 2022-11-19 DIAGNOSIS — M793 Panniculitis, unspecified: Secondary | ICD-10-CM | POA: Diagnosis not present

## 2022-11-19 DIAGNOSIS — Z85828 Personal history of other malignant neoplasm of skin: Secondary | ICD-10-CM | POA: Diagnosis not present

## 2023-01-09 DIAGNOSIS — H401132 Primary open-angle glaucoma, bilateral, moderate stage: Secondary | ICD-10-CM | POA: Diagnosis not present

## 2023-01-16 ENCOUNTER — Encounter: Payer: Self-pay | Admitting: Cardiovascular Disease

## 2023-01-16 ENCOUNTER — Ambulatory Visit: Payer: PPO | Attending: Cardiovascular Disease | Admitting: Cardiovascular Disease

## 2023-01-16 VITALS — BP 128/68 | HR 54 | Ht 63.0 in | Wt 114.4 lb

## 2023-01-16 DIAGNOSIS — I10 Essential (primary) hypertension: Secondary | ICD-10-CM | POA: Diagnosis not present

## 2023-01-16 DIAGNOSIS — R001 Bradycardia, unspecified: Secondary | ICD-10-CM

## 2023-01-16 NOTE — Progress Notes (Signed)
Chief Complaint  Patient presents with   Follow-up    bradycardia   History of Present Illness: 89 yo female with history of HTN and bradycardia who is here today for cardiac follow up. I saw her for the first time in 2019.  I also take care of her husband. She has been followed by Dr. Caryl Comes for bradycardia. She had a near syncopal event in June 2018 at home with low heart and low BP. Dr. Caryl Comes evaluated her and did not suggest further evaluation. Echo August 2019 with LVEF=60-65%. No valve disease. When I saw her in November 2020 her heart rate was 57 bpm and she was feeling well.   She is here today for follow up. The patient denies any chest pain, dyspnea, palpitations, lower extremity edema, orthopnea, PND, dizziness, near syncope or syncope.    Primary Care Physician: Carol Ada, MD  Past Medical History:  Diagnosis Date   Bradycardia    Glaucoma    Hypertension    Nodular basal cell carcinoma    Osteoporosis    Thyroid disease     Past Surgical History:  Procedure Laterality Date   ABDOMINAL HYSTERECTOMY     ANKLE FRACTURE SURGERY     BASAL CELL CARCINOMA EXCISION  2017   BLADDER SURGERY     CATARACT EXTRACTION, BILATERAL Bilateral 12 & 11 of 2016   CERVICAL CONE BIOPSY     DILATION AND CURETTAGE OF UTERUS     FRACTURE SURGERY     OSTEOCHONDROMA EXCISION     TONSILLECTOMY      Current Outpatient Medications  Medication Sig Dispense Refill   amLODipine (NORVASC) 10 MG tablet Take 1 tablet (10 mg total) by mouth daily. 90 tablet 3   clobetasol cream (TEMOVATE) 1.61 % Apply 1 Application topically 2 (two) times daily.     latanoprost (XALATAN) 0.005 % ophthalmic solution Place 1 drop into both eyes at bedtime.     levothyroxine (SYNTHROID) 112 MCG tablet Take 112 mcg by mouth daily before breakfast.      lisinopril (ZESTRIL) 20 MG tablet Take 1 tablet by mouth once daily 90 tablet 3   naproxen sodium (ALEVE) 220 MG tablet Take 220 mg by mouth daily as needed  (pain).     timolol (BETIMOL) 0.5 % ophthalmic solution Place 1 drop into both eyes 2 (two) times daily.     No current facility-administered medications for this visit.    Allergies  Allergen Reactions   Actonel [Risedronate Sodium]     Serve muscle pain   Evista [Raloxifene Hcl]     Leg cramps    Social History   Socioeconomic History   Marital status: Married    Spouse name: Not on file   Number of children: 4   Years of education: Not on file   Highest education level: Not on file  Occupational History   Occupation: Retired-works at [employer]  Tobacco Use   Smoking status: Never   Smokeless tobacco: Never  Vaping Use   Vaping Use: Never used  Substance and Sexual Activity   Alcohol use: Yes    Comment: glass of wine per day   Drug use: No   Sexual activity: Not Currently  Other Topics Concern   Not on file  Social History Narrative   Not on file   Social Determinants of Health   Financial Resource Strain: Not on file  Food Insecurity: Not on file  Transportation Needs: Not on file  Physical Activity:  Not on file  Stress: Not on file  Social Connections: Not on file  Intimate Partner Violence: Not on file    Family History  Problem Relation Age of Onset   Other Mother        gun shot wound   Hypertension Father    Cancer Sister        breast   Other Sister        mets   CVA Brother    Diabetes Brother    Hypertension Brother    Rheum arthritis Daughter    Diabetes Son        on dialysis    Review of Systems:  As stated in the HPI and otherwise negative.   BP 128/68   Pulse (!) 54   Ht '5\' 3"'$  (1.6 m)   Wt 51.9 kg   SpO2 98%   BMI 20.27 kg/m   Physical Examination:  General: Well developed, well nourished, NAD  HEENT: OP clear, mucus membranes moist  SKIN: warm, dry. No rashes. Neuro: No focal deficits  Musculoskeletal: Muscle strength 5/5 all ext  Psychiatric: Mood and affect normal  Neck: No JVD, no carotid bruits, no  thyromegaly, no lymphadenopathy.  Lungs:Clear bilaterally, no wheezes, rhonci, crackles Cardiovascular: Regular rate and rhythm. No murmurs, gallops or rubs. Abdomen:Soft. Bowel sounds present. Non-tender.  Extremities: No lower extremity edema. Pulses are 2 + in the bilateral DP/PT.  EKG:  EKG is ordered today. The ekg ordered today demonstrates Sinus brady, rate 54 bpm  Echo August 2019: Left ventricle: The cavity size was normal. Wall thickness was   normal. Systolic function was normal. The estimated ejection   fraction was in the range of 60% to 65%. The study is not   technically sufficient to allow evaluation of LV diastolic   function.  Recent Labs: No results found for requested labs within last 365 days.   Lipid Panel No results found for: "CHOL", "TRIG", "HDL", "CHOLHDL", "VLDL", "LDLCALC", "LDLDIRECT"   Wt Readings from Last 3 Encounters:  01/16/23 51.9 kg  12/27/21 50.4 kg  04/23/21 54.4 kg    Assessment and Plan:   1. Bradycardia: Heart rate is 54 bpm. No dizziness  2. HTN: BP is well controlled. No changes.   3. Leg pain: Normal ABI in 2023.   Labs/ tests ordered today include:   Orders Placed This Encounter  Procedures   EKG 12-Lead   Disposition:   FU with me in 12 months  Signed, Lauree Chandler, MD 01/16/2023 3:11 PM    [facility] [place], [place], [place]  [address] Phone: [phone]; Fax: [phone]

## 2023-01-16 NOTE — Patient Instructions (Signed)
Medication Instructions:  No changes *If you need a refill on your cardiac medications before your next appointment, please call your pharmacy*   Lab Work: none   Testing/Procedures: none   Follow-Up: At [facility], you and your health needs are our priority.  As part of our continuing mission to provide you with exceptional heart care, we have created designated Provider Care Teams.  These Care Teams include your primary Cardiologist (physician) and Advanced Practice Providers (APPs -  Physician Assistants and Nurse Practitioners) who all work together to provide you with the care you need, when you need it.   Your next appointment:   12 month(s)  Provider:   Christopher McAlhany, MD      

## 2023-01-30 DIAGNOSIS — Z85828 Personal history of other malignant neoplasm of skin: Secondary | ICD-10-CM | POA: Diagnosis not present

## 2023-01-30 DIAGNOSIS — M793 Panniculitis, unspecified: Secondary | ICD-10-CM | POA: Diagnosis not present

## 2023-02-01 DIAGNOSIS — S8012XA Contusion of left lower leg, initial encounter: Secondary | ICD-10-CM | POA: Diagnosis not present

## 2023-02-25 ENCOUNTER — Other Ambulatory Visit: Payer: Self-pay | Admitting: Cardiovascular Disease

## 2023-04-01 DIAGNOSIS — M793 Panniculitis, unspecified: Secondary | ICD-10-CM | POA: Diagnosis not present

## 2023-04-01 DIAGNOSIS — Z85828 Personal history of other malignant neoplasm of skin: Secondary | ICD-10-CM | POA: Diagnosis not present

## 2023-06-10 DIAGNOSIS — Z85828 Personal history of other malignant neoplasm of skin: Secondary | ICD-10-CM | POA: Diagnosis not present

## 2023-06-10 DIAGNOSIS — M793 Panniculitis, unspecified: Secondary | ICD-10-CM | POA: Diagnosis not present

## 2023-06-10 DIAGNOSIS — L821 Other seborrheic keratosis: Secondary | ICD-10-CM | POA: Diagnosis not present

## 2023-07-09 DIAGNOSIS — Z961 Presence of intraocular lens: Secondary | ICD-10-CM | POA: Diagnosis not present

## 2023-07-09 DIAGNOSIS — H401132 Primary open-angle glaucoma, bilateral, moderate stage: Secondary | ICD-10-CM | POA: Diagnosis not present

## 2023-07-09 DIAGNOSIS — H52203 Unspecified astigmatism, bilateral: Secondary | ICD-10-CM | POA: Diagnosis not present

## 2023-07-19 DIAGNOSIS — H5711 Ocular pain, right eye: Secondary | ICD-10-CM | POA: Diagnosis not present

## 2023-07-23 DIAGNOSIS — J449 Chronic obstructive pulmonary disease, unspecified: Secondary | ICD-10-CM | POA: Diagnosis not present

## 2023-07-23 DIAGNOSIS — E559 Vitamin D deficiency, unspecified: Secondary | ICD-10-CM | POA: Diagnosis not present

## 2023-07-23 DIAGNOSIS — E7800 Pure hypercholesterolemia, unspecified: Secondary | ICD-10-CM | POA: Diagnosis not present

## 2023-07-23 DIAGNOSIS — E039 Hypothyroidism, unspecified: Secondary | ICD-10-CM | POA: Diagnosis not present

## 2023-07-23 DIAGNOSIS — I10 Essential (primary) hypertension: Secondary | ICD-10-CM | POA: Diagnosis not present

## 2023-07-23 DIAGNOSIS — Z1331 Encounter for screening for depression: Secondary | ICD-10-CM | POA: Diagnosis not present

## 2023-07-23 DIAGNOSIS — M810 Age-related osteoporosis without current pathological fracture: Secondary | ICD-10-CM | POA: Diagnosis not present

## 2023-07-23 DIAGNOSIS — Z0000 Encounter for general adult medical examination without abnormal findings: Secondary | ICD-10-CM | POA: Diagnosis not present

## 2023-08-26 DIAGNOSIS — B0052 Herpesviral keratitis: Secondary | ICD-10-CM | POA: Diagnosis not present

## 2023-08-29 DIAGNOSIS — H16041 Marginal corneal ulcer, right eye: Secondary | ICD-10-CM | POA: Diagnosis not present

## 2023-09-05 DIAGNOSIS — H16041 Marginal corneal ulcer, right eye: Secondary | ICD-10-CM | POA: Diagnosis not present

## 2023-09-20 DIAGNOSIS — D23111 Other benign neoplasm of skin of right upper eyelid, including canthus: Secondary | ICD-10-CM | POA: Diagnosis not present

## 2023-09-24 DIAGNOSIS — Z85828 Personal history of other malignant neoplasm of skin: Secondary | ICD-10-CM | POA: Diagnosis not present

## 2023-09-24 DIAGNOSIS — M7981 Nontraumatic hematoma of soft tissue: Secondary | ICD-10-CM | POA: Diagnosis not present

## 2023-09-24 DIAGNOSIS — M793 Panniculitis, unspecified: Secondary | ICD-10-CM | POA: Diagnosis not present

## 2023-12-29 NOTE — Progress Notes (Signed)
 Chief Complaint  Patient presents with   Follow-up    Bradycardia    History of Present Illness: 89 yo female with history of HTN and bradycardia who is here today for cardiac follow up. I saw her for the first time in 2019.  I also take care of her husband. She has been followed by Dr. Fernande for bradycardia. She had a near syncopal event in June 2018 at home with low heart and low BP. Dr. Fernande evaluated her and did not suggest further evaluation. Echo August 2019 with LVEF=60-65%. No valve disease. No issues when seen in my office in 2024.   She is here today for follow up. The patient denies any chest pain, dyspnea, palpitations, lower extremity edema, orthopnea, PND, dizziness, near syncope or syncope.     Primary Care Physician: Claudene Pellet, MD  Past Medical History:  Diagnosis Date   Bradycardia    Glaucoma    Hypertension    Nodular basal cell carcinoma    Osteoporosis    Thyroid  disease     Past Surgical History:  Procedure Laterality Date   ABDOMINAL HYSTERECTOMY     ANKLE FRACTURE SURGERY     BASAL CELL CARCINOMA EXCISION  2017   BLADDER SURGERY     CATARACT EXTRACTION, BILATERAL Bilateral 12 & 11 of 2016   CERVICAL CONE BIOPSY     DILATION AND CURETTAGE OF UTERUS     FRACTURE SURGERY     OSTEOCHONDROMA EXCISION     TONSILLECTOMY      Current Outpatient Medications  Medication Sig Dispense Refill   amLODipine  (NORVASC ) 10 MG tablet Take 1 tablet by mouth once daily 90 tablet 3   latanoprost (XALATAN) 0.005 % ophthalmic solution Place 1 drop into both eyes at bedtime.     levothyroxine (SYNTHROID) 112 MCG tablet Take 112 mcg by mouth daily before breakfast.      lisinopril  (ZESTRIL ) 20 MG tablet Take 1 tablet by mouth once daily 90 tablet 3   naproxen sodium (ALEVE) 220 MG tablet Take 220 mg by mouth daily as needed (pain).     timolol (BETIMOL) 0.5 % ophthalmic solution Place 1 drop into both eyes 2 (two) times daily.     clobetasol cream (TEMOVATE) 0.05  % Apply 1 Application topically 2 (two) times daily. (Patient not taking: Reported on 12/30/2023)     No current facility-administered medications for this visit.    Allergies  Allergen Reactions   Actonel [Risedronate Sodium]     Serve muscle pain   Evista [Raloxifene Hcl]     Leg cramps    Social History   Socioeconomic History   Marital status: Married    Spouse name: Not on file   Number of children: 4   Years of education: Not on file   Highest education level: Not on file  Occupational History   Occupation: Retired-works at pharmacist, hospital  Tobacco Use   Smoking status: Never   Smokeless tobacco: Never  Vaping Use   Vaping status: Never Used  Substance and Sexual Activity   Alcohol use: Yes    Comment: glass of wine per day   Drug use: No   Sexual activity: Not Currently  Other Topics Concern   Not on file  Social History Narrative   Not on file   Social Drivers of Health   Financial Resource Strain: Not on file  Food Insecurity: Not on file  Transportation Needs: Not on file  Physical Activity: Not on file  Stress: Not on file  Social Connections: Not on file  Intimate Partner Violence: Not on file    Family History  Problem Relation Age of Onset   Other Mother        gun shot wound   Hypertension Father    Cancer Sister        breast   Other Sister        mets   CVA Brother    Diabetes Brother    Hypertension Brother    Rheum arthritis Daughter    Diabetes Son        on dialysis    Review of Systems:  As stated in the HPI and otherwise negative.   BP (!) 146/66   Pulse (!) 55   Ht 5' 2 (1.575 m)   Wt 48.9 kg   SpO2 98%   BMI 19.72 kg/m   Physical Examination: General: Well developed, well nourished, NAD  HEENT: OP clear, mucus membranes moist  SKIN: warm, dry. No rashes. Neuro: No focal deficits  Musculoskeletal: Muscle strength 5/5 all ext  Psychiatric: Mood and affect normal  Neck: No JVD, no carotid bruits, no thyromegaly,  no lymphadenopathy.  Lungs:Clear bilaterally, no wheezes, rhonci, crackles Cardiovascular: Regular rate and rhythm. No murmurs, gallops or rubs. Abdomen:Soft. Bowel sounds present. Non-tender.  Extremities: No lower extremity edema. Pulses are 2 + in the bilateral DP/PT.  EKG:  EKG is ordered today. The ekg ordered today demonstrates  EKG Interpretation Date/Time:  Monday December 30 2023 10:03:56 EST Ventricular Rate:  55 PR Interval:  144 QRS Duration:  90 QT Interval:  418 QTC Calculation: 399 R Axis:   59  Text Interpretation: Sinus bradycardia Confirmed by Verlin Bruckner [phone]) on 12/30/2023 10:16:11 AM    Echo August 2019: Left ventricle: The cavity size was normal. Wall thickness was   normal. Systolic function was normal. The estimated ejection   fraction was in the range of 60% to 65%. The study is not   technically sufficient to allow evaluation of LV diastolic   function.  Recent Labs: No results found for requested labs within last 365 days.   Lipid Panel No results found for: CHOL, TRIG, HDL, CHOLHDL, VLDL, LDLCALC, LDLDIRECT   Wt Readings from Last 3 Encounters:  12/30/23 48.9 kg  01/16/23 51.9 kg  12/27/21 50.4 kg    Assessment and Plan:   1. Bradycardia: Heart rate is 55 bpm. No dizziness  2. HTN: BP is controlled at home. She did not take her medications yet today. No changes today.   3. Leg pain: Normal ABI in 2023.   Labs/ tests ordered today include:   Orders Placed This Encounter  Procedures   EKG 12-Lead   Disposition:   F/U with me in 12 months  Signed, Bruckner Verlin, MD 12/30/2023 10:37 AM    [facility] [address] [place], [place], [place]  [address] Phone: [phone]; Fax: [phone]

## 2023-12-30 ENCOUNTER — Ambulatory Visit: Payer: PPO | Attending: Cardiovascular Disease | Admitting: Cardiovascular Disease

## 2023-12-30 ENCOUNTER — Encounter: Payer: Self-pay | Admitting: Cardiovascular Disease

## 2023-12-30 VITALS — BP 146/66 | HR 55 | Ht 62.0 in | Wt 107.8 lb

## 2023-12-30 DIAGNOSIS — R001 Bradycardia, unspecified: Secondary | ICD-10-CM

## 2023-12-30 DIAGNOSIS — I10 Essential (primary) hypertension: Secondary | ICD-10-CM | POA: Diagnosis not present

## 2023-12-30 NOTE — Patient Instructions (Signed)
Medication Instructions:  No changes *If you need a refill on your cardiac medications before your next appointment, please call your pharmacy*   Lab Work: none If you have labs (blood work) drawn today and your tests are completely normal, you will receive your results only by: [place] (if you have MyChart) OR A paper copy in the mail If you have any lab test that is abnormal or we need to change your treatment, we will call you to review the results.   Testing/Procedures: none   Follow-Up: At [facility], you and your health needs are our priority.  As part of our continuing mission to provide you with exceptional heart care, we have created designated Provider Care Teams.  These Care Teams include your primary Cardiologist (physician) and Advanced Practice Providers (APPs -  Physician Assistants and Nurse Practitioners) who all work together to provide you with the care you need, when you need it.   Your next appointment:   12 month(s)  Provider:   Lauree Chandler, MD     Other Instructions

## 2024-05-25 ENCOUNTER — Other Ambulatory Visit: Payer: Self-pay | Admitting: Cardiovascular Disease

## 2024-05-26 ENCOUNTER — Other Ambulatory Visit: Payer: Self-pay | Admitting: *Deleted

## 2024-05-26 MED ORDER — AMLODIPINE BESYLATE 10 MG PO TABS: 10.0000 mg | ORAL_TABLET | Freq: Every day | ORAL | 2 refills | Status: AC

## 2024-07-14 DIAGNOSIS — H401132 Primary open-angle glaucoma, bilateral, moderate stage: Secondary | ICD-10-CM | POA: Diagnosis not present

## 2024-07-14 DIAGNOSIS — Z961 Presence of intraocular lens: Secondary | ICD-10-CM | POA: Diagnosis not present

## 2024-07-14 DIAGNOSIS — H5213 Myopia, bilateral: Secondary | ICD-10-CM | POA: Diagnosis not present

## 2024-08-24 DIAGNOSIS — Z0000 Encounter for general adult medical examination without abnormal findings: Secondary | ICD-10-CM | POA: Diagnosis not present

## 2024-08-24 DIAGNOSIS — E559 Vitamin D deficiency, unspecified: Secondary | ICD-10-CM | POA: Diagnosis not present

## 2024-08-24 DIAGNOSIS — H409 Unspecified glaucoma: Secondary | ICD-10-CM | POA: Diagnosis not present

## 2024-08-24 DIAGNOSIS — Z1331 Encounter for screening for depression: Secondary | ICD-10-CM | POA: Diagnosis not present

## 2024-08-24 DIAGNOSIS — E039 Hypothyroidism, unspecified: Secondary | ICD-10-CM | POA: Diagnosis not present

## 2024-08-24 DIAGNOSIS — E7800 Pure hypercholesterolemia, unspecified: Secondary | ICD-10-CM | POA: Diagnosis not present

## 2024-08-24 DIAGNOSIS — I10 Essential (primary) hypertension: Secondary | ICD-10-CM | POA: Diagnosis not present

## 2024-09-21 DIAGNOSIS — H02102 Unspecified ectropion of right lower eyelid: Secondary | ICD-10-CM | POA: Diagnosis not present

## 2024-09-21 DIAGNOSIS — H02105 Unspecified ectropion of left lower eyelid: Secondary | ICD-10-CM | POA: Diagnosis not present

## 2024-09-21 DIAGNOSIS — H02403 Unspecified ptosis of bilateral eyelids: Secondary | ICD-10-CM | POA: Diagnosis not present

## 2024-09-21 DIAGNOSIS — Z961 Presence of intraocular lens: Secondary | ICD-10-CM | POA: Diagnosis not present

## 2024-09-21 DIAGNOSIS — H401132 Primary open-angle glaucoma, bilateral, moderate stage: Secondary | ICD-10-CM | POA: Diagnosis not present

## 2024-09-21 DIAGNOSIS — H5213 Myopia, bilateral: Secondary | ICD-10-CM | POA: Diagnosis not present

## 2024-12-11 ENCOUNTER — Telehealth: Payer: Self-pay | Admitting: Cardiovascular Disease

## 2024-12-11 NOTE — Telephone Encounter (Signed)
 Pt would like to know if there is any day in January Dr. Verlin could see them both. Please advise.

## 2024-12-11 NOTE — Telephone Encounter (Signed)
 Informed that there are not any appt's available for Dr Verlin in January. She would like to get her and her husband seen in January together, that is when they are due.   Informed her that I will send this information to her provider and covering nurse to see if she can get an appt for them. She was thankful and verbalized understanding.

## 2024-12-21 NOTE — Telephone Encounter (Signed)
 I spoke with patient and scheduled her to see Dr Verlin on January 21 at 11:20.  Appointment for patient's husband scheduled for same day at 11:40

## 2025-01-05 NOTE — Progress Notes (Unsigned)
 "  No chief complaint on file.  History of Present Illness: 89 yo female with history of HTN and bradycardia who is here today for cardiac follow up. I saw her for the first time in 2019.  I also take care of her husband. She has been followed in our EP clinic by Dr. Fernande for bradycardia. She had a near syncopal event in June 2018 at home with low heart and low BP. Dr. Fernande evaluated her and did not suggest further evaluation. Echo August 2019 with LVEF=60-65%. No valve disease. No issues when seen in my office in 2024.   She is here today for follow up. The patient denies any chest pain, dyspnea, palpitations, lower extremity edema, orthopnea, PND, dizziness, near syncope or syncope.    Primary Care Physician: Claudene Pellet, MD  Past Medical History:  Diagnosis Date   Bradycardia    Glaucoma    Hypertension    Nodular basal cell carcinoma    Osteoporosis    Thyroid  disease     Past Surgical History:  Procedure Laterality Date   ABDOMINAL HYSTERECTOMY     ANKLE FRACTURE SURGERY     BASAL CELL CARCINOMA EXCISION  2017   BLADDER SURGERY     CATARACT EXTRACTION, BILATERAL Bilateral 12 & 11 of 2016   CERVICAL CONE BIOPSY     DILATION AND CURETTAGE OF UTERUS     FRACTURE SURGERY     OSTEOCHONDROMA EXCISION     TONSILLECTOMY      Current Outpatient Medications  Medication Sig Dispense Refill   amLODipine  (NORVASC ) 10 MG tablet Take 1 tablet (10 mg total) by mouth daily. 90 tablet 2   clobetasol cream (TEMOVATE) 0.05 % Apply 1 Application topically 2 (two) times daily. (Patient not taking: Reported on 12/30/2023)     latanoprost (XALATAN) 0.005 % ophthalmic solution Place 1 drop into both eyes at bedtime.     levothyroxine (SYNTHROID) 112 MCG tablet Take 112 mcg by mouth daily before breakfast.      lisinopril  (ZESTRIL ) 20 MG tablet Take 1 tablet by mouth once daily 90 tablet 3   naproxen sodium (ALEVE) 220 MG tablet Take 220 mg by mouth daily as needed (pain).     timolol  (BETIMOL) 0.5 % ophthalmic solution Place 1 drop into both eyes 2 (two) times daily.     No current facility-administered medications for this visit.    Allergies  Allergen Reactions   Actonel [Risedronate Sodium]     Serve muscle pain   Evista [Raloxifene Hcl]     Leg cramps    Social History   Socioeconomic History   Marital status: Married    Spouse name: Not on file   Number of children: 4   Years of education: Not on file   Highest education level: Not on file  Occupational History   Occupation: Retired-works at pharmacist, hospital  Tobacco Use   Smoking status: Never   Smokeless tobacco: Never  Vaping Use   Vaping status: Never Used  Substance and Sexual Activity   Alcohol use: Yes    Comment: glass of wine per day   Drug use: No   Sexual activity: Not Currently  Other Topics Concern   Not on file  Social History Narrative   Not on file   Social Drivers of Health   Tobacco Use: Low Risk (12/30/2023)   Patient History    Smoking Tobacco Use: Never    Smokeless Tobacco Use: Never    Passive Exposure:  Not on file  Financial Resource Strain: Not on file  Food Insecurity: Not on file  Transportation Needs: Not on file  Physical Activity: Not on file  Stress: Not on file  Social Connections: Not on file  Intimate Partner Violence: Not on file  Depression (PHQ2-9): Not on file  Alcohol Screen: Not on file  Housing: Not on file  Utilities: Not on file  Health Literacy: Not on file    Family History  Problem Relation Age of Onset   Other Mother        gun shot wound   Hypertension Father    Cancer Sister        breast   Other Sister        mets   CVA Brother    Diabetes Brother    Hypertension Brother    Rheum arthritis Daughter    Diabetes Son        on dialysis    Review of Systems:  As stated in the HPI and otherwise negative.   There were no vitals taken for this visit.  Physical Examination: General: Well developed, well nourished, NAD   SKIN: warm, dry. Neuro: No focal deficits  Psychiatric: Mood and affect normal  Neck: No JVD Lungs:Clear bilaterally, no wheezes, rhonci, crackles Cardiovascular: Regular rate and rhythm. No murmurs, gallops or rubs. Abdomen:Soft.  Extremities: No lower extremity edema.    EKG:  EKG is *** ordered today. The ekg ordered today demonstrates  Echo August 2019: Left ventricle: The cavity size was normal. Wall thickness was   normal. Systolic function was normal. The estimated ejection   fraction was in the range of 60% to 65%. The study is not   technically sufficient to allow evaluation of LV diastolic   function.  Recent Labs: No results found for requested labs within last 365 days.   Lipid Panel No results found for: CHOL, TRIG, HDL, CHOLHDL, VLDL, LDLCALC, LDLDIRECT   Wt Readings from Last 3 Encounters:  12/30/23 107 lb 12.8 oz (48.9 kg)  01/16/23 114 lb 6.4 oz (51.9 kg)  12/27/21 111 lb 3.2 oz (50.4 kg)    Assessment and Plan:   1. Bradycardia: Heart rate is ***  bpm. She has no dizziness.   2. HTN: BP is well controlled.  -Continue Lisinopril  and Norvasc   3. Leg pain: Normal ABI in 2023.   Labs/ tests ordered today include:  No orders of the defined types were placed in this encounter.  Disposition:   F/U with me in 12 months  Signed, Lonni Cash, MD 01/05/2025 9:34 PM    [facility] [address] [place], [place], [place]  [address] Phone: [phone]; Fax: [phone]  "

## 2025-01-06 ENCOUNTER — Encounter: Payer: Self-pay | Admitting: Cardiovascular Disease

## 2025-01-06 ENCOUNTER — Ambulatory Visit: Attending: Cardiovascular Disease | Admitting: Cardiovascular Disease

## 2025-01-06 VITALS — BP 104/64 | HR 52 | Ht 62.0 in | Wt 110.0 lb

## 2025-01-06 DIAGNOSIS — I10 Essential (primary) hypertension: Secondary | ICD-10-CM

## 2025-01-06 DIAGNOSIS — R001 Bradycardia, unspecified: Secondary | ICD-10-CM | POA: Diagnosis not present

## 2025-01-06 NOTE — Patient Instructions (Signed)
 Medication Instructions:  No Changes  Testing/Procedures: None  Follow-Up: At [facility], you and your health needs are our priority.  As part of our continuing mission to provide you with exceptional heart care, our providers are all part of one team.  This team includes your primary Cardiologist (physician) and Advanced Practice Providers or APPs (Physician Assistants and Nurse Practitioners) who all work together to provide you with the care you need, when you need it.  Your next appointment:   1 year(s)  Provider:   Lonni Cash, MD

## 2025-02-01 ENCOUNTER — Ambulatory Visit: Admitting: Emergency Medicine
